# Patient Record
Sex: Female | Born: 1975 | Race: White | Hispanic: No | Marital: Single | Smoking: Former smoker
Health system: Southern US, Community
[De-identification: ages and names within clinical notes are randomized; demographics above are authoritative.]

## PROBLEM LIST (undated history)

## (undated) DIAGNOSIS — M5020 Other cervical disc displacement, unspecified cervical region: Secondary | ICD-10-CM

## (undated) DIAGNOSIS — Z9109 Other allergy status, other than to drugs and biological substances: Secondary | ICD-10-CM

## (undated) DIAGNOSIS — Z973 Presence of spectacles and contact lenses: Secondary | ICD-10-CM

## (undated) DIAGNOSIS — G839 Paralytic syndrome, unspecified: Secondary | ICD-10-CM

## (undated) HISTORY — PX: OVARIAN CYST REMOVAL: SHX89

## (undated) HISTORY — PX: TONSILLECTOMY: SUR1361

---

## 2014-02-22 ENCOUNTER — Other Ambulatory Visit (HOSPITAL_COMMUNITY): Payer: Self-pay | Admitting: Orthopaedic Surgery

## 2014-02-25 ENCOUNTER — Encounter (HOSPITAL_COMMUNITY): Payer: Self-pay | Admitting: *Deleted

## 2014-02-25 DIAGNOSIS — M5000 Cervical disc disorder with myelopathy, unspecified cervical region: Secondary | ICD-10-CM | POA: Diagnosis present

## 2014-02-25 NOTE — H&P (Signed)
Kylie Romero is an 38 y.o. female.   Chief Complaint: neck and left arm pain. HPI: referred by Dr. 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  The patient does pizza delivery.  She has been having trouble walking with spasticity of her left lower extremity and left upper extremity.  She states she has had cramping with squeezing of her fingers, numbness in her left upper extremity and difficulty with gait and now has noticed some problems with her opposite right lower extremity as well.  Onset was about 4 months ago.  She has had some progression, but still has been able to work.  She is normally followed by Dr. Altamease OilerNoelle Redmon.The patient states that she has had some difficulty with walking, fatigue, tiredness of her legs and difficulty with stairs.  She has had previous EMGs/nerve conduction velocity back in January which were normal.  Her symptoms have gradually progressed since that point in time and now she is having more difficulty ambulating.  She has not had to use a cane, but sometimes grabs the counter or other objects with ambulation.  RADIOGRAPHS/TEST:   C-spine x-rays are reviewed with the patient which shows no Klippel-Feil deformity.  No hemivertebrae.  Slight sclerosis of the end plates of Z6-1C5-6.     MRI scan from 02/10/2014 show disk osteophyte complex at C5-6 with severe central stenosis with cord edematous changes at the level of the disk slightly above and inferior behind the C6 vertebral body that extends almost to the next disk space.  At C6-7 level there is mild disk protrusion without contact of the cord.  No past medical history on file.  No past surgical history on file.  No family history on file. Social History:  has no tobacco, alcohol, and drug history on file.  Allergies: No Known Allergies  No prescriptions prior to admission    No results found for this or any previous visit (from the past 48 hour(s)). No results found.  Review of  Systems  Musculoskeletal: Positive for neck pain.  Neurological: Positive for tingling, sensory change and focal weakness.  All other systems reviewed and are negative.   There were no vitals taken for this visit. Physical Exam  Constitutional: She is oriented to person, place, and time. She appears well-developed and well-nourished.  HENT:  Head: Normocephalic and atraumatic.  Eyes: EOM are normal. Pupils are equal, round, and reactive to light.  Cardiovascular: Normal rate and regular rhythm.   Respiratory: Effort normal and breath sounds normal.  GI: Soft. Bowel sounds are normal.  Musculoskeletal:   She has severe brachial plexus tenderness worse on the left than right, positive Spurling, positive Lhermitte, positive Hoffman sign.  There is bilateral lower extremity hyperreflexia 4+ with clonus worse on the left than right.  There is 3+ upper extremity reflexes, biceps, triceps, brachioradialis.  She has 5-/5 biceps and wrist flexion, wrist extension.  There is overall 5-/5 grip on the left versus normal on the right.  She has some quad weakness on the left with stairs.  She has to go up with the right foot first.    Neurological: She is alert and oriented to person, place, and time.  Skin: Skin is warm and dry.  Psychiatric: She has a normal mood and affect.     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  She has severe central stenosis PLAN:  Anterior cervical discectomy and fusion C5-6 with allograft,plate and  screws.  Wende NeighborsSheila M Carlene Bickley 02/25/2014, 10:24 AM

## 2014-02-27 MED ORDER — CEFAZOLIN SODIUM-DEXTROSE 2-3 GM-% IV SOLR: 2.0000 g | INTRAVENOUS | Status: DC

## 2014-02-28 ENCOUNTER — Ambulatory Visit (HOSPITAL_COMMUNITY): Payer: 59

## 2014-02-28 ENCOUNTER — Encounter (HOSPITAL_COMMUNITY): Payer: 59 | Admitting: Certified Registered Nurse Anesthetist

## 2014-02-28 ENCOUNTER — Ambulatory Visit (HOSPITAL_COMMUNITY): Payer: 59 | Admitting: Certified Registered Nurse Anesthetist

## 2014-02-28 ENCOUNTER — Ambulatory Visit (HOSPITAL_COMMUNITY)
Admission: RE | Admit: 2014-02-28 | Discharge: 2014-03-01 | Disposition: A | Discharge status: Home / Self Care | Payer: 59 | Source: Ambulatory Visit | Attending: Orthopaedic Surgery | Admitting: Orthopaedic Surgery

## 2014-02-28 ENCOUNTER — Encounter (HOSPITAL_COMMUNITY): Payer: Self-pay | Admitting: *Deleted

## 2014-02-28 ENCOUNTER — Encounter (HOSPITAL_COMMUNITY)
Admission: RE | Disposition: A | Discharge status: Home / Self Care | Payer: Self-pay | Source: Ambulatory Visit | Attending: Orthopaedic Surgery

## 2014-02-28 DIAGNOSIS — M4712 Other spondylosis with myelopathy, cervical region: Secondary | ICD-10-CM | POA: Diagnosis present

## 2014-02-28 DIAGNOSIS — J4489 Other specified chronic obstructive pulmonary disease: Secondary | ICD-10-CM | POA: Insufficient documentation

## 2014-02-28 DIAGNOSIS — Z87891 Personal history of nicotine dependence: Secondary | ICD-10-CM | POA: Insufficient documentation

## 2014-02-28 DIAGNOSIS — J449 Chronic obstructive pulmonary disease, unspecified: Secondary | ICD-10-CM | POA: Insufficient documentation

## 2014-02-28 DIAGNOSIS — M5000 Cervical disc disorder with myelopathy, unspecified cervical region: Secondary | ICD-10-CM | POA: Insufficient documentation

## 2014-02-28 HISTORY — DX: Other allergy status, other than to drugs and biological substances: Z91.09

## 2014-02-28 HISTORY — PX: ANTERIOR CERVICAL DECOMP/DISCECTOMY FUSION: SHX1161

## 2014-02-28 LAB — CBC
HCT: 35.9 % — ABNORMAL LOW (ref 36.0–46.0)
HEMATOCRIT: 37.5 % (ref 36.0–46.0)
HEMOGLOBIN: 13.3 g/dL (ref 12.0–15.0)
Hemoglobin: 12.5 g/dL (ref 12.0–15.0)
MCH: 32.7 pg (ref 26.0–34.0)
MCH: 32 pg (ref 26.0–34.0)
MCHC: 35.5 g/dL (ref 30.0–36.0)
MCHC: 34.8 g/dL (ref 30.0–36.0)
MCV: 92.1 fL (ref 78.0–100.0)
MCV: 91.8 fL (ref 78.0–100.0)
Platelets: 228 10*3/uL (ref 150–400)
Platelets: 212 10*3/uL (ref 150–400)
RBC: 4.07 MIL/uL (ref 3.87–5.11)
RBC: 3.91 MIL/uL (ref 3.87–5.11)
RDW: 12.4 % (ref 11.5–15.5)
RDW: 12.1 % (ref 11.5–15.5)
WBC: 8.2 10*3/uL (ref 4.0–10.5)
WBC: 7.9 10*3/uL (ref 4.0–10.5)

## 2014-02-28 LAB — BASIC METABOLIC PANEL
BUN: 11 mg/dL (ref 6–23)
CHLORIDE: 108 meq/L (ref 96–112)
CO2: 22 mEq/L (ref 19–32)
Calcium: 9 mg/dL (ref 8.4–10.5)
Creatinine, Ser: 0.69 mg/dL (ref 0.50–1.10)
GFR calc Af Amer: >90 mL/min (ref >90)
GLUCOSE: 88 mg/dL (ref 70–99)
POTASSIUM: 4.9 meq/L (ref 3.7–5.3)
SODIUM: 142 meq/L (ref 137–147)

## 2014-02-28 LAB — URINE MICROSCOPIC-ADD ON

## 2014-02-28 LAB — URINALYSIS, ROUTINE W REFLEX MICROSCOPIC
Bilirubin Urine: NEGATIVE
Glucose, UA: NEGATIVE mg/dL
Hgb urine dipstick: NEGATIVE
Ketones, ur: 15 mg/dL — AB
NITRITE: NEGATIVE
PH: 6.5 (ref 5.0–8.0)
Protein, ur: NEGATIVE mg/dL
SPECIFIC GRAVITY, URINE: 1.016 (ref 1.005–1.030)
Urobilinogen, UA: 1 mg/dL (ref 0.0–1.0)

## 2014-02-28 LAB — SURGICAL PCR SCREEN
MRSA, PCR: NEGATIVE
Staphylococcus aureus: NEGATIVE

## 2014-02-28 LAB — HCG, SERUM, QUALITATIVE: Preg, Serum: NEGATIVE

## 2014-02-28 LAB — PROTIME-INR
INR: 1.08 (ref 0.00–1.49)
Prothrombin Time: 13.8 seconds (ref 11.6–15.2)

## 2014-02-28 SURGERY — ANTERIOR CERVICAL DECOMPRESSION/DISCECTOMY FUSION 1 LEVEL
Anesthesia: General | Site: Neck

## 2014-02-28 MED ORDER — CEFAZOLIN SODIUM-DEXTROSE 2-3 GM-% IV SOLR
INTRAVENOUS | Status: AC
Start: 2014-02-28 — End: 2014-03-01
  Filled 2014-02-28: qty 50

## 2014-02-28 MED ORDER — METHOCARBAMOL 500 MG PO TABS
500.0000 mg | ORAL_TABLET | Freq: Four times a day (QID) | ORAL | Status: DC | PRN
  Administered 2014-02-28 – 2014-03-01 (×2): 500 mg via ORAL
  Filled 2014-02-28 (×2): qty 1

## 2014-02-28 MED ORDER — FENTANYL CITRATE 0.05 MG/ML IJ SOLN
INTRAMUSCULAR | Status: DC | PRN
Start: 2014-02-28 — End: 2014-02-28
  Administered 2014-02-28 (×2): 50 ug via INTRAVENOUS
  Administered 2014-02-28: 150 ug via INTRAVENOUS

## 2014-02-28 MED ORDER — OXYCODONE-ACETAMINOPHEN 5-325 MG PO TABS: 1.0000 | ORAL_TABLET | ORAL | Status: DC | PRN

## 2014-02-28 MED ORDER — MUPIROCIN 2 % EX OINT
TOPICAL_OINTMENT | Freq: Two times a day (BID) | CUTANEOUS | Status: DC
  Administered 2014-02-28: 13:00:00 via NASAL
  Filled 2014-02-28: qty 22

## 2014-02-28 MED ORDER — PHENYLEPHRINE 40 MCG/ML (10ML) SYRINGE FOR IV PUSH (FOR BLOOD PRESSURE SUPPORT)
PREFILLED_SYRINGE | INTRAVENOUS | Status: AC
  Filled 2014-02-28: qty 10

## 2014-02-28 MED ORDER — PHENOL 1.4 % MT LIQD
1.0000 | OROMUCOSAL | Status: DC | PRN
  Filled 2014-02-28: qty 177

## 2014-02-28 MED ORDER — GLYCOPYRROLATE 0.2 MG/ML IJ SOLN
INTRAMUSCULAR | Status: AC
  Filled 2014-02-28: qty 3

## 2014-02-28 MED ORDER — NEOSTIGMINE METHYLSULFATE 1 MG/ML IJ SOLN
INTRAMUSCULAR | Status: AC
  Filled 2014-02-28: qty 10

## 2014-02-28 MED ORDER — BUPIVACAINE-EPINEPHRINE 0.25% -1:200000 IJ SOLN
INTRAMUSCULAR | Status: DC | PRN
  Administered 2014-02-28: 30 mL

## 2014-02-28 MED ORDER — KETOROLAC TROMETHAMINE 30 MG/ML IJ SOLN
30.0000 mg | Freq: Once | INTRAMUSCULAR | Status: AC
  Administered 2014-02-28: 30 mg via INTRAVENOUS
  Filled 2014-02-28: qty 1

## 2014-02-28 MED ORDER — ACETAMINOPHEN 325 MG PO TABS: 325.0000 mg | ORAL_TABLET | ORAL | Status: DC | PRN

## 2014-02-28 MED ORDER — SODIUM CHLORIDE 0.9 % IJ SOLN: 3.0000 mL | INTRAMUSCULAR | Status: DC | PRN

## 2014-02-28 MED ORDER — THROMBIN 20000 UNITS EX SOLR
CUTANEOUS | Status: DC | PRN
  Administered 2014-02-28: 16:00:00 via TOPICAL

## 2014-02-28 MED ORDER — ACETAMINOPHEN 160 MG/5ML PO SOLN
325.0000 mg | ORAL | Status: DC | PRN
  Filled 2014-02-28: qty 20.3

## 2014-02-28 MED ORDER — LORATADINE 10 MG PO TABS
10.0000 mg | ORAL_TABLET | Freq: Every day | ORAL | Status: DC
  Administered 2014-02-28: 10 mg via ORAL
  Filled 2014-02-28 (×2): qty 1

## 2014-02-28 MED ORDER — LIDOCAINE HCL (CARDIAC) 20 MG/ML IV SOLN
INTRAVENOUS | Status: AC
  Filled 2014-02-28: qty 5

## 2014-02-28 MED ORDER — ACETAMINOPHEN 325 MG PO TABS: 650.0000 mg | ORAL_TABLET | ORAL | Status: DC | PRN

## 2014-02-28 MED ORDER — PHENYLEPHRINE HCL 10 MG/ML IJ SOLN
INTRAMUSCULAR | Status: DC | PRN
Start: 2014-02-28 — End: 2014-02-28
  Administered 2014-02-28 (×3): 40 ug via INTRAVENOUS

## 2014-02-28 MED ORDER — NEOSTIGMINE METHYLSULFATE 1 MG/ML IJ SOLN
INTRAMUSCULAR | Status: DC | PRN
  Administered 2014-02-28: 4 mg via INTRAVENOUS

## 2014-02-28 MED ORDER — KETOROLAC TROMETHAMINE 30 MG/ML IJ SOLN
15.0000 mg | Freq: Once | INTRAMUSCULAR | Status: AC | PRN
  Administered 2014-02-28: 15 mg via INTRAVENOUS

## 2014-02-28 MED ORDER — MORPHINE SULFATE 2 MG/ML IJ SOLN: 1.0000 mg | INTRAMUSCULAR | Status: DC | PRN

## 2014-02-28 MED ORDER — GLYCOPYRROLATE 0.2 MG/ML IJ SOLN
INTRAMUSCULAR | Status: DC | PRN
  Administered 2014-02-28: .7 mg via INTRAVENOUS

## 2014-02-28 MED ORDER — ROCURONIUM BROMIDE 50 MG/5ML IV SOLN
INTRAVENOUS | Status: AC
  Filled 2014-02-28: qty 1

## 2014-02-28 MED ORDER — BUPIVACAINE-EPINEPHRINE (PF) 0.25% -1:200000 IJ SOLN
INTRAMUSCULAR | Status: AC
  Filled 2014-02-28: qty 30

## 2014-02-28 MED ORDER — PROPOFOL 10 MG/ML IV BOLUS
INTRAVENOUS | Status: DC | PRN
  Administered 2014-02-28: 140 mg via INTRAVENOUS
  Administered 2014-02-28: 50 mg via INTRAVENOUS

## 2014-02-28 MED ORDER — KETOROLAC TROMETHAMINE 30 MG/ML IJ SOLN
INTRAMUSCULAR | Status: AC
  Filled 2014-02-28: qty 1

## 2014-02-28 MED ORDER — DEXAMETHASONE SODIUM PHOSPHATE 4 MG/ML IJ SOLN
INTRAMUSCULAR | Status: DC | PRN
  Administered 2014-02-28: 4 mg via INTRAVENOUS

## 2014-02-28 MED ORDER — HYDROCODONE-ACETAMINOPHEN 5-325 MG PO TABS: 1.0000 | ORAL_TABLET | ORAL | Status: DC | PRN

## 2014-02-28 MED ORDER — KCL IN DEXTROSE-NACL 20-5-0.45 MEQ/L-%-% IV SOLN
INTRAVENOUS | Status: DC
  Filled 2014-02-28 (×3): qty 1000

## 2014-02-28 MED ORDER — POLYETHYLENE GLYCOL 3350 17 G PO PACK
17.0000 g | PACK | Freq: Every day | ORAL | Status: DC | PRN
  Filled 2014-02-28: qty 1

## 2014-02-28 MED ORDER — METHOCARBAMOL 100 MG/ML IJ SOLN: 500.0000 mg | Freq: Four times a day (QID) | INTRAVENOUS | Status: DC | PRN

## 2014-02-28 MED ORDER — SODIUM CHLORIDE 0.9 % IJ SOLN: 3.0000 mL | Freq: Two times a day (BID) | INTRAMUSCULAR | Status: DC

## 2014-02-28 MED ORDER — MIDAZOLAM HCL 2 MG/2ML IJ SOLN
INTRAMUSCULAR | Status: AC
  Filled 2014-02-28: qty 2

## 2014-02-28 MED ORDER — BISACODYL 10 MG RE SUPP: 10.0000 mg | Freq: Every day | RECTAL | Status: DC | PRN

## 2014-02-28 MED ORDER — SODIUM CHLORIDE 0.9 % IV SOLN: 250.0000 mL | INTRAVENOUS | Status: DC

## 2014-02-28 MED ORDER — FENTANYL CITRATE 0.05 MG/ML IJ SOLN
INTRAMUSCULAR | Status: AC
  Filled 2014-02-28: qty 5

## 2014-02-28 MED ORDER — CEFAZOLIN SODIUM-DEXTROSE 2-3 GM-% IV SOLR
INTRAVENOUS | Status: DC | PRN
  Administered 2014-02-28: 2 g via INTRAVENOUS

## 2014-02-28 MED ORDER — HYDROMORPHONE HCL PF 1 MG/ML IJ SOLN
INTRAMUSCULAR | Status: AC
  Filled 2014-02-28: qty 1

## 2014-02-28 MED ORDER — ACETAMINOPHEN 650 MG RE SUPP: 650.0000 mg | RECTAL | Status: DC | PRN

## 2014-02-28 MED ORDER — ONDANSETRON HCL 4 MG/2ML IJ SOLN
INTRAMUSCULAR | Status: DC | PRN
  Administered 2014-02-28: 4 mg via INTRAVENOUS

## 2014-02-28 MED ORDER — LIDOCAINE HCL (CARDIAC) 20 MG/ML IV SOLN
INTRAVENOUS | Status: DC | PRN
  Administered 2014-02-28: 80 mg via INTRAVENOUS

## 2014-02-28 MED ORDER — ONDANSETRON HCL 4 MG/2ML IJ SOLN
INTRAMUSCULAR | Status: AC
  Filled 2014-02-28: qty 2

## 2014-02-28 MED ORDER — MENTHOL 3 MG MT LOZG
1.0000 | LOZENGE | OROMUCOSAL | Status: DC | PRN
  Filled 2014-02-28: qty 9

## 2014-02-28 MED ORDER — MUPIROCIN 2 % EX OINT
TOPICAL_OINTMENT | CUTANEOUS | Status: AC
  Filled 2014-02-28: qty 22

## 2014-02-28 MED ORDER — PANTOPRAZOLE SODIUM 40 MG IV SOLR
40.0000 mg | Freq: Every day | INTRAVENOUS | Status: DC
  Administered 2014-02-28: 40 mg via INTRAVENOUS
  Filled 2014-02-28 (×2): qty 40

## 2014-02-28 MED ORDER — OXYCODONE-ACETAMINOPHEN 5-325 MG PO TABS
1.0000 | ORAL_TABLET | ORAL | Status: DC | PRN
  Administered 2014-03-01: 2 via ORAL
  Administered 2014-03-01: 1 via ORAL
  Filled 2014-02-28: qty 1
  Filled 2014-02-28: qty 2

## 2014-02-28 MED ORDER — LACTATED RINGERS IV SOLN
INTRAVENOUS | Status: DC | PRN
  Administered 2014-02-28 (×2): via INTRAVENOUS

## 2014-02-28 MED ORDER — ONDANSETRON HCL 4 MG/2ML IJ SOLN: 4.0000 mg | Freq: Once | INTRAMUSCULAR | Status: DC | PRN

## 2014-02-28 MED ORDER — METHOCARBAMOL 500 MG PO TABS: 500.0000 mg | ORAL_TABLET | Freq: Four times a day (QID) | ORAL | Status: DC | PRN

## 2014-02-28 MED ORDER — ONDANSETRON HCL 4 MG/2ML IJ SOLN: 4.0000 mg | INTRAMUSCULAR | Status: DC | PRN

## 2014-02-28 MED ORDER — DOCUSATE SODIUM 100 MG PO CAPS
100.0000 mg | ORAL_CAPSULE | Freq: Two times a day (BID) | ORAL | Status: DC
  Administered 2014-02-28: 100 mg via ORAL
  Filled 2014-02-28 (×3): qty 1

## 2014-02-28 MED ORDER — CEFAZOLIN SODIUM 1-5 GM-% IV SOLN
1.0000 g | Freq: Three times a day (TID) | INTRAVENOUS | Status: AC
  Administered 2014-02-28 – 2014-03-01 (×2): 1 g via INTRAVENOUS
  Filled 2014-02-28 (×2): qty 50

## 2014-02-28 MED ORDER — THROMBIN 20000 UNITS EX SOLR
CUTANEOUS | Status: AC
  Filled 2014-02-28: qty 20000

## 2014-02-28 MED ORDER — 0.9 % SODIUM CHLORIDE (POUR BTL) OPTIME
TOPICAL | Status: DC | PRN
  Administered 2014-02-28: 1000 mL

## 2014-02-28 MED ORDER — HYDROMORPHONE HCL PF 1 MG/ML IJ SOLN
0.2500 mg | INTRAMUSCULAR | Status: DC | PRN
  Administered 2014-02-28 (×2): 0.5 mg via INTRAVENOUS

## 2014-02-28 MED ORDER — FLEET ENEMA 7-19 GM/118ML RE ENEM: 1.0000 | ENEMA | Freq: Once | RECTAL | Status: AC | PRN

## 2014-02-28 SURGICAL SUPPLY — 60 items
BENZOIN TINCTURE PRP APPL 2/3 (GAUZE/BANDAGES/DRESSINGS) ×2 IMPLANT
BIT DRILL SKYLINE 12MM (BIT) ×1 IMPLANT
BLADE SURG ROTATE 9660 (MISCELLANEOUS) IMPLANT
BONE CERV LORDOTIC 14.5X12X6 (Bone Implant) ×2 IMPLANT
BUR ROUND FLUTED 4 SOFT TCH (BURR) IMPLANT
CLSR STERI-STRIP ANTIMIC 1/2X4 (GAUZE/BANDAGES/DRESSINGS) ×2 IMPLANT
COLLAR CERV LO CONTOUR FIRM DE (SOFTGOODS) ×2 IMPLANT
CORDS BIPOLAR (ELECTRODE) ×2 IMPLANT
COVER MAYO STAND STRL (DRAPES) ×2 IMPLANT
COVER SURGICAL LIGHT HANDLE (MISCELLANEOUS) ×2 IMPLANT
DERMABOND ADVANCED (GAUZE/BANDAGES/DRESSINGS) ×1
DERMABOND ADVANCED .7 DNX12 (GAUZE/BANDAGES/DRESSINGS) ×1 IMPLANT
DRAPE C-ARM 42X72 X-RAY (DRAPES) ×2 IMPLANT
DRAPE INCISE IOBAN 66X45 STRL (DRAPES) ×2 IMPLANT
DRAPE MICROSCOPE LEICA (MISCELLANEOUS) ×2 IMPLANT
DRAPE ORTHO SPLIT 77X108 STRL (DRAPES) ×2
DRAPE PROXIMA HALF (DRAPES) ×2 IMPLANT
DRAPE SURG ORHT 6 SPLT 77X108 (DRAPES) ×2 IMPLANT
DRILL BIT SKYLINE 12MM (BIT) ×1
DRSG MEPILEX BORDER 4X4 (GAUZE/BANDAGES/DRESSINGS) ×2 IMPLANT
DRSG MEPILEX BORDER 4X8 (GAUZE/BANDAGES/DRESSINGS) ×2 IMPLANT
DURAPREP 6ML APPLICATOR 50/CS (WOUND CARE) ×2 IMPLANT
ELECT COATED BLADE 2.86 ST (ELECTRODE) ×2 IMPLANT
ELECT REM PT RETURN 9FT ADLT (ELECTROSURGICAL) ×2
ELECTRODE REM PT RTRN 9FT ADLT (ELECTROSURGICAL) ×1 IMPLANT
EVACUATOR 1/8 PVC DRAIN (DRAIN) ×2 IMPLANT
GAUZE XEROFORM 1X8 LF (GAUZE/BANDAGES/DRESSINGS) ×4 IMPLANT
GLOVE BIOGEL PI IND STRL 7.5 (GLOVE) ×1 IMPLANT
GLOVE BIOGEL PI IND STRL 8 (GLOVE) ×1 IMPLANT
GLOVE BIOGEL PI INDICATOR 7.5 (GLOVE) ×1
GLOVE BIOGEL PI INDICATOR 8 (GLOVE) ×1
GLOVE ECLIPSE 7.0 STRL STRAW (GLOVE) ×2 IMPLANT
GLOVE ORTHO TXT STRL SZ7.5 (GLOVE) ×2 IMPLANT
GOWN STRL REUS W/ TWL LRG LVL3 (GOWN DISPOSABLE) ×3 IMPLANT
GOWN STRL REUS W/ TWL XL LVL3 (GOWN DISPOSABLE) ×1 IMPLANT
GOWN STRL REUS W/TWL LRG LVL3 (GOWN DISPOSABLE) ×3
GOWN STRL REUS W/TWL XL LVL3 (GOWN DISPOSABLE) ×1
HEAD HALTER (SOFTGOODS) ×2 IMPLANT
HEMOSTAT SURGICEL 2X14 (HEMOSTASIS) IMPLANT
KIT BASIN OR (CUSTOM PROCEDURE TRAY) ×2 IMPLANT
KIT ROOM TURNOVER OR (KITS) ×2 IMPLANT
MANIFOLD NEPTUNE II (INSTRUMENTS) IMPLANT
NEEDLE 25GX 5/8IN NON SAFETY (NEEDLE) ×2 IMPLANT
NS IRRIG 1000ML POUR BTL (IV SOLUTION) ×2 IMPLANT
PACK ORTHO CERVICAL (CUSTOM PROCEDURE TRAY) ×2 IMPLANT
PAD ARMBOARD 7.5X6 YLW CONV (MISCELLANEOUS) ×4 IMPLANT
PATTIES SURGICAL .5 X.5 (GAUZE/BANDAGES/DRESSINGS) IMPLANT
PLATE SKYLINE 12MM (Plate) ×2 IMPLANT
SCREW VARIABLE SELF TAP 12MM (Screw) ×8 IMPLANT
SPONGE GAUZE 4X4 12PLY (GAUZE/BANDAGES/DRESSINGS) ×2 IMPLANT
SPONGE GAUZE 4X4 12PLY STER LF (GAUZE/BANDAGES/DRESSINGS) ×2 IMPLANT
SURGIFLO TRUKIT (HEMOSTASIS) IMPLANT
SUT VIC AB 3-0 X1 27 (SUTURE) ×2 IMPLANT
SUT VICRYL 4-0 PS2 18IN ABS (SUTURE) ×4 IMPLANT
SYR 30ML SLIP (SYRINGE) ×2 IMPLANT
SYR BULB 3OZ (MISCELLANEOUS) ×2 IMPLANT
TAPE CLOTH SURG 4X10 WHT LF (GAUZE/BANDAGES/DRESSINGS) ×2 IMPLANT
TOWEL OR 17X24 6PK STRL BLUE (TOWEL DISPOSABLE) ×2 IMPLANT
TOWEL OR 17X26 10 PK STRL BLUE (TOWEL DISPOSABLE) ×2 IMPLANT
WATER STERILE IRR 1000ML POUR (IV SOLUTION) ×2 IMPLANT

## 2014-02-28 NOTE — Anesthesia Postprocedure Evaluation (Signed)
  Anesthesia Post-op Note  Patient: Oleh GeninAlexis Tiu  Procedure(s) Performed: Procedure(s) with comments: ANTERIOR CERVICAL DECOMPRESSION/DISCECTOMY FUSION 1 LEVEL (N/A) - C5-6 Anterior Cervical Discectomy and Fusion, Allograft, Plate  Patient Location: PACU  Anesthesia Type:General  Level of Consciousness: awake and alert   Airway and Oxygen Therapy: Patient Spontanous Breathing  Post-op Pain: mild  Post-op Assessment: Post-op Vital signs reviewed, Patient's Cardiovascular Status Stable, Respiratory Function Stable, Patent Airway, No signs of Nausea or vomiting and Adequate PO intake  Post-op Vital Signs: Reviewed and stable  Last Vitals:  Filed Vitals:   02/28/14 1834  BP:   Pulse:   Temp: 37.1 C  Resp:     Complications: No apparent anesthesia complications

## 2014-02-28 NOTE — Interval H&P Note (Signed)
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 After consideration of risks, benefits and other options for treatment, the patient has consented to  Procedure(s) with comments: ANTERIOR CERVICAL DECOMPRESSION/DISCECTOMY FUSION 1 LEVEL (N/A) - C5-6 Anterior Cervical Discectomy and Fusion, Allograft, Plate as a surgical intervention .  The patient's history has been reviewed, patient examined, no change in status, stable for surgery.  I have reviewed the patient's chart and labs.  Questions were answered to the patient's satisfaction.     Eldred MangesMark C Santhiago Collingsworth

## 2014-02-28 NOTE — Anesthesia Preprocedure Evaluation (Addendum)
Anesthesia Evaluation  Patient identified by MRN, date of birth, ID band Patient awake    Reviewed: Allergy & Precautions, H&P , NPO status , Patient's Chart, lab work & pertinent test results, reviewed documented beta blocker date and time   History of Anesthesia Complications Negative for: history of anesthetic complications  Airway Mallampati: I TM Distance: >3 FB Neck ROM: Limited    Dental  (+) Teeth Intact, Dental Advisory Given   Pulmonary neg COPDformer smoker,  Neck pain with left arm numbness/weakness, no changes with neck movement breath sounds clear to auscultation        Cardiovascular negative cardio ROS  Rhythm:Regular     Neuro/Psych negative neurological ROS  negative psych ROS   GI/Hepatic negative GI ROS, Neg liver ROS,   Endo/Other  negative endocrine ROS  Renal/GU negative Renal ROS     Musculoskeletal   Abdominal   Peds  Hematology negative hematology ROS (+)   Anesthesia Other Findings   Reproductive/Obstetrics                          Anesthesia Physical Anesthesia Plan  ASA: II  Anesthesia Plan: General   Post-op Pain Management:    Induction: Intravenous  Airway Management Planned: Oral ETT  Additional Equipment: None  Intra-op Plan:   Post-operative Plan: Extubation in OR  Informed Consent: I have reviewed the patients History and Physical, chart, labs and discussed the procedure including the risks, benefits and alternatives for the proposed anesthesia with the patient or authorized representative who has indicated his/her understanding and acceptance.   Dental advisory given  Plan Discussed with: CRNA and Surgeon  Anesthesia Plan Comments:        Anesthesia Quick Evaluation

## 2014-02-28 NOTE — Transfer of Care (Signed)
Immediate Anesthesia Transfer of Care Note  Patient: Kylie GeninAlexis Romero  Procedure(s) Performed: Procedure(s) with comments: ANTERIOR CERVICAL DECOMPRESSION/DISCECTOMY FUSION 1 LEVEL (N/A) - C5-6 Anterior Cervical Discectomy and Fusion, Allograft, Plate  Patient Location: PACU  Anesthesia Type:General  Level of Consciousness: awake and alert   Airway & Oxygen Therapy: Patient Spontanous Breathing and Patient connected to nasal cannula oxygen  Post-op Assessment: Report given to PACU RN, Post -op Vital signs reviewed and stable and Patient moving all extremities X 4  Post vital signs: Reviewed and stable  Complications: No apparent anesthesia complications

## 2014-02-28 NOTE — Discharge Instructions (Signed)
No lifting greater than 10 lbs. No overhead use of arms. °Avoid bending,and twisting neck. °Walk in house for first week them may start to get out slowly increasing distance up to one mile by 3 weeks post op. °Keep incision dry for 3 days, may then bathe and wet incision using a covered collar when showering. °Call if any fevers >101, chills, or increasing numbness or weakness or increased swelling or drainage. ° °

## 2014-02-28 NOTE — Brief Op Note (Cosign Needed)
02/28/2014  4:36 PM  PATIENT:  Kylie Romero  38 y.o. 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  POST-OPERATIVE DIAGNOSIS:  C5-6 HNP with Myelopathy  PROCEDURE:  Procedure(s) with comments: ANTERIOR CERVICAL DECOMPRESSION/DISCECTOMY FUSION 1 LEVEL (N/A) - C5-6 Anterior Cervical Discectomy and Fusion, Allograft, Plate  SURGEON:  Surgeon(s) and Role:    * Eldred MangesMark C Yates, MD - Primary  PHYSICIAN ASSISTANT: Maud DeedSheila Shoichi Mielke [facility]  ASSISTANTS: none   ANESTHESIA:   general  EBL:  Total I/O In: 1000 [I.V.:1000] Out: -   BLOOD ADMINISTERED:none  DRAINS: (1) Hemovact drain(s) in the anterior neck with  Suction Open   LOCAL MEDICATIONS USED:  MARCAINE     SPECIMEN:  No Specimen  DISPOSITION OF SPECIMEN:  N/A  COUNTS:  YES  TOURNIQUET:  * No tourniquets in log *  DICTATION: .Note written in EPIC  PLAN OF CARE: Admit for overnight observation  PATIENT DISPOSITION:  PACU - hemodynamically stable.   Delay start of Pharmacological VTE agent (>24hrs) due to surgical blood loss or risk of bleeding: yes

## 2014-02-28 NOTE — Anesthesia Procedure Notes (Signed)
Procedure Name: Intubation Date/Time: 02/28/2014 3:06 PM Performed by: Reine JustFLOWERS, Wong Steadham T Pre-anesthesia Checklist: Patient identified, Emergency Drugs available, Suction available, Patient being monitored and Timeout performed Patient Re-evaluated:Patient Re-evaluated prior to inductionOxygen Delivery Method: Circle system utilized and Simple face mask Preoxygenation: Pre-oxygenation with 100% oxygen Intubation Type: IV induction Ventilation: Mask ventilation without difficulty and Oral airway inserted - appropriate to patient size Laryngoscope size: elective glidescope. Grade View: Grade I Tube type: Oral Tube size: 7.5 mm Number of attempts: 1 Airway Equipment and Method: Patient positioned with wedge pillow,  Stylet and Video-laryngoscopy Placement Confirmation: ETT inserted through vocal cords under direct vision,  positive ETCO2 and breath sounds checked- equal and bilateral Secured at: 22 cm Tube secured with: Tape Dental Injury: Teeth and Oropharynx as per pre-operative assessment

## 2014-02-28 NOTE — Op Note (Signed)
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  Assistant: Maud DeedSheila Vernon PA-C medically necessary and present for the entire procedure  Anesthesia Gen. plus Marcaine skin local  Complications none  EBL minimal.  Procedure after induction general anesthesia standard prepping draping the timeout procedure was completed patient had halter traction applied DuraPrep was used area square totalis sterile skin marker Betadine half Steri-Drape sterile Mayo stand  at the head and split sheets and drapes were used. Patient preoperatively had extreme weakness in the left upper and lower extremity. She had symptoms for 4 months an MRI scan showed edema of the cord at the C5-6 level and slightly inferior. Patient had some short pedicles. Minimal degeneration at C6-7 without compression. Timeout procedure was completed. Incision was made starting at the midline extending to the left and lung palpable landmarks there were C5-6. Platysma was divided in line with the skin incision blunt dissection with longus COLI muscles split at the midline and the spurs C5-6 was identified confirmed with crosstable lateral C-arm after was appropriately draped. Operative microscope was draped and brought in. Spurs were taken down disc was removed there was a large amount of disc protruding posteriorly and once decompression and removal of the hypertrophic ligament was performed the dura was seen bulging out with all ligament resected. There were no extruded fragments present. 6 mm graft was selected so to avoid over stretching the cord since there was arty some edema. Endplates were filed at the down and some additional touchup with the burn hang curettes to make sure that the graft  fit and nice and snug with the maximum body contact. There is egressed on each side for any fluid. Graft was countersunk 2 mm . A 12 mm skyline plate was selected 12 mm screws were inserted above and below for total of 4 screws. C-arm was used to confirm good position and alignment. Hemovac drain was placed in line with skin incision. Tincture benzoin Steri-Strips 4 x 4's tape soft cervical collar was applied postoperatively. Patient tolerated the procedure well was transferred to the recovery in in stable condition signed Vernon PreyMark Maigan Bittinger M.D.

## 2014-03-01 ENCOUNTER — Encounter (HOSPITAL_COMMUNITY): Payer: Self-pay | Admitting: Orthopaedic Surgery

## 2014-03-01 DIAGNOSIS — M4712 Other spondylosis with myelopathy, cervical region: Secondary | ICD-10-CM | POA: Diagnosis not present

## 2014-03-01 NOTE — Progress Notes (Signed)
Orthopedic Tech Progress Note Patient Details:  Kylie Romero 15-Apr-1976 [id number] Extra soft collar provided as requested for home use Ortho Devices Type of Ortho Device: Soft collar Ortho Device/Splint Interventions: Ordered   Asia R Thompson 03/01/2014, 8:56 AM

## 2014-03-01 NOTE — Progress Notes (Signed)
Patient ID: Kylie Romero, female   DOB: 1976-07-27, 38 y.o.   MRN: [id number]     Subjective: 1 Day Post-Op Procedure(s) (LRB): ANTERIOR CERVICAL DECOMPRESSION/DISCECTOMY FUSION 1 LEVEL (N/A) Patient reports pain as mild.    Objective: Vital signs in last 24 hours: Temp:  [97.8 F (36.6 C)-98.8 F (37.1 C)] 98.6 F (37 C) (04/21 0413) Pulse Rate:  [63-103] 67 (04/21 0413) Resp:  [11-22] 17 (04/21 0413) BP: (101-134)/(57-82) 101/57 mmHg (04/21 0413) SpO2:  [84 %-100 %] 97 % (04/21 0413) Weight:  [68.153 kg (150 lb 4 oz)] 68.153 kg (150 lb 4 oz) (04/20 1242)  Intake/Output from previous day: 04/20 0701 - 04/21 0700 In: 1300 [I.V.:1300] Out: 50 [Blood:50] Intake/Output this shift:     Recent Labs  02/28/14 1253 02/28/14 1354  HGB 13.3 12.5    Recent Labs  02/28/14 1253 02/28/14 1354  WBC 8.2 7.9  RBC 4.07 3.91  HCT 37.5 35.9*  PLT 228 212    Recent Labs  02/28/14 1354  NA 142  K 4.9  CL 108  CO2 22  BUN 11  CREATININE 0.69  GLUCOSE 88  CALCIUM 9.0    Recent Labs  02/28/14 1253  INR 1.08    no change in left UE and left LE spasticity. persistant numbness due to cord edema . she had had progression of weakness over 4 months and increase over last month.   Assessment/Plan: 1 Day Post-Op Procedure(s) (LRB): ANTERIOR CERVICAL DECOMPRESSION/DISCECTOMY FUSION 1 LEVEL (N/A) Plan: discharge home. Collar stays on at all times.  Eldred MangesMark C Zyonna Vardaman 03/01/2014, 7:49 AM

## 2014-03-01 NOTE — Progress Notes (Signed)
Pt. Alert and oriented, follows simple instructions, denies pain. Incision area without swelling, redness or S/S of infection. Voiding adequate clear yellow urine. Moving all extremities well and vitals stable and documented. Patient discharged home with spouse. Anterior Cervical Fusion surgery notes instructions given to patient and family member for home safety and precautions. Pt. and family stated understanding of instructions given.  

## 2019-04-22 ENCOUNTER — Emergency Department (HOSPITAL_BASED_OUTPATIENT_CLINIC_OR_DEPARTMENT_OTHER)
Admission: EM | Admit: 2019-04-22 | Discharge: 2019-04-22 | Disposition: A | Discharge status: Home / Self Care | Payer: BC Managed Care – PPO | Attending: Emergency Medicine | Admitting: Emergency Medicine

## 2019-04-22 ENCOUNTER — Other Ambulatory Visit: Payer: Self-pay

## 2019-04-22 ENCOUNTER — Encounter (HOSPITAL_BASED_OUTPATIENT_CLINIC_OR_DEPARTMENT_OTHER): Payer: Self-pay | Admitting: *Deleted

## 2019-04-22 DIAGNOSIS — M542 Cervicalgia: Secondary | ICD-10-CM | POA: Diagnosis present

## 2019-04-22 DIAGNOSIS — M5412 Radiculopathy, cervical region: Secondary | ICD-10-CM | POA: Diagnosis not present

## 2019-04-22 DIAGNOSIS — Z79899 Other long term (current) drug therapy: Secondary | ICD-10-CM | POA: Diagnosis not present

## 2019-04-22 DIAGNOSIS — Z87891 Personal history of nicotine dependence: Secondary | ICD-10-CM | POA: Diagnosis not present

## 2019-04-22 MED ORDER — OXYCODONE-ACETAMINOPHEN 5-325 MG PO TABS
1.0000 | ORAL_TABLET | Freq: Once | ORAL | Status: AC
  Administered 2019-04-22: 1 via ORAL
  Filled 2019-04-22: qty 1

## 2019-04-22 MED ORDER — METHOCARBAMOL 500 MG PO TABS: 500.0000 mg | ORAL_TABLET | Freq: Three times a day (TID) | ORAL | 0 refills | Status: AC | PRN

## 2019-04-22 MED ORDER — OXYCODONE-ACETAMINOPHEN 5-325 MG PO TABS: 1.0000 | ORAL_TABLET | Freq: Four times a day (QID) | ORAL | 0 refills | Status: DC | PRN

## 2019-04-22 NOTE — Discharge Instructions (Signed)
It was my pleasure taking care of you today!   I have given you a prescription for medication and a muscle relaxant. These can make you very drowsy - please do not drink alcohol, operate heavy machinery or drive on this medication.   In addition to these medications, you also can take ibuprofen or Aleve to help as well.  I have put in an order for you to get an MRI at the [facility].  I was told that they would call you.  I have listed the contact information.  If you have not heard from them by tomorrow afternoon, please give them a call.  Go to the  [facility] emergency department should you have any weakness of the right arm, new or worsening symptoms or any additional concerns.

## 2019-04-22 NOTE — ED Provider Notes (Signed)
[facility] EMERGENCY DEPARTMENT Provider Note   CSN: [id number] Arrival date & time: 04/22/19  1544    History   Chief Complaint Chief Complaint  Patient presents with  . Neck Pain    HPI Kylie Romero is a 43 y.o. female.     The history is provided by the patient and medical records. No language interpreter was used.  Neck Pain Associated symptoms: numbness   Associated symptoms: no weakness    Kylie Romero is a 43 y.o. female  with a PMH of Brown-Squard syndrome, prior cervical discectomy and fusion who presents to the Emergency Department complaining of progressively worsening right-sided neck pain for the last 3 days.  Pain gets much worse when she tries to lift her arm above her head as if to brush her hair.  He has tried several over-the-counter pain relievers with little improvement.  She reports that she is used to having symptoms with the left side of her body such as contracture to her hand, neck pain and spasticity, but she has never had any issues with her right side before.  She denies any weakness to the right upper extremity, but does note numbness to the left lateral aspect of her hand intermittently for about 2 to 3 weeks now.   Past Medical History:  Diagnosis Date  . Pollen allergies     Patient Active Problem List   Diagnosis Date Noted  . HNP (herniated nucleus pulposus) with myelopathy, cervical 02/25/2014    Past Surgical History:  Procedure Laterality Date  . ANTERIOR CERVICAL DECOMP/DISCECTOMY FUSION N/A 02/28/2014   Procedure: ANTERIOR CERVICAL DECOMPRESSION/DISCECTOMY FUSION 1 LEVEL;  Surgeon: Eldred MangesMark C Yates, MD;  Location: [facility] OR;  Service: Orthopedics;  Laterality: N/A;  C5-6 Anterior Cervical Discectomy and Fusion, Allograft, Plate  . OVARIAN CYST REMOVAL     age 43  . TONSILLECTOMY     age of 5     OB History   No obstetric history on file.      Home Medications    Prior to Admission medications   Medication Sig Start Date  End Date Taking? Authorizing Provider  buPROPion (WELLBUTRIN XL) 300 MG 24 hr tablet  04/17/17  Yes [provider]  EPINEPHrine HCl (ASTHMANEFRIN IN) Inhale 1 puff into the lungs daily.    [provider]  fexofenadine-pseudoephedrine (ALLEGRA-D 24) 180-240 MG per 24 hr tablet Take 1 tablet by mouth daily.    [provider]  methocarbamol (ROBAXIN) 500 MG tablet Take 1 tablet (500 mg total) by mouth every 8 (eight) hours as needed for muscle spasms (spasm). 04/22/19   Ashelynn Marks, Chase PicketJaime Pilcher, PA-C  oxyCODONE-acetaminophen (ROXICET) 5-325 MG tablet Take 1 tablet by mouth every 6 (six) hours as needed. 04/22/19   Joshua Zeringue, Chase PicketJaime Pilcher, PA-C    Family History No family history on file.  Social History Social History   Tobacco Use  . Smoking status: Former Smoker    Years: 15.00  . Smokeless tobacco: Current User  Substance Use Topics  . Alcohol use: No    Comment: 3 drinks a month maybe  . Drug use: No     Allergies   Patient has no known allergies.   Review of Systems Review of Systems  Musculoskeletal: Positive for neck pain.  Neurological: Positive for numbness. Negative for weakness.  All other systems reviewed and are negative.    Physical Exam Updated Vital Signs BP 127/86   Pulse 95   Temp 98.3 F (36.8 C) (Oral)  Resp 14   Ht 5\' 6"  (1.676 m)   Wt 81.2 kg   LMP 04/08/2019   SpO2 100%   BMI 28.89 kg/m   Physical Exam Vitals signs and nursing note reviewed.  Constitutional:      General: She is not in acute distress.    Appearance: She is well-developed.  HENT:     Head: Normocephalic and atraumatic.  Neck:     Musculoskeletal: Neck supple.     Comments: No reproducible neck tenderness.  Full range of motion of the neck. Cardiovascular:     Rate and Rhythm: Normal rate and regular rhythm.     Heart sounds: Normal heart sounds. No murmur.  Pulmonary:     Effort: Pulmonary effort is normal. No respiratory distress.     Breath  sounds: Normal breath sounds.  Abdominal:     General: There is no distension.     Palpations: Abdomen is soft.     Tenderness: There is no abdominal tenderness.  Musculoskeletal:     Comments: Pain when lifting her arm above her head.  Full strength to the right upper extremity.  Reported decreased sensation to the ulnar aspect of the hand.  Strong grip strength and pincer grasp.  Able to keep thumb up against resistance.  Skin:    General: Skin is warm and dry.  Neurological:     Mental Status: She is alert and oriented to person, place, and time.      ED Treatments / Results  Labs (all labs ordered are listed, but only abnormal results are displayed) Labs Reviewed - No data to display  EKG None  Radiology No results found.  Procedures Procedures (including critical care time)  Medications Ordered in ED Medications  oxyCODONE-acetaminophen (PERCOCET/ROXICET) 5-325 MG per tablet 1 tablet (1 tablet Oral Given 04/22/19 1707)     Initial Impression / Assessment and Plan / ED Course  I have reviewed the triage vital signs and the nursing notes.  Pertinent labs & imaging results that were available during my care of the patient were reviewed by me and considered in my medical decision making (see chart for details).       Kylie Romero is a 42 y.o. female who presents to ED for numbness to the ulnar side of her right hand for 2 weeks with new right-sided neck pain for the last 3 days.  Pain does radiate down her arm.  Worse with certain movements, but is not reproducible with palpation.  She has complex medical history including Brown-Squard syndrome and cervical fusion.  She has no weakness on exam today, but does have subjective diminished sensation on aspect of her right hand.  Other than sensory deficit, no other exam findings of concern.  Feel that she does need an MRI, but not necessarily emergently. No MRI at facility today here.  Made arrangements for her to get MRI at  at [facility] on Sunday.  Gust symptomatic management and follow-up care including imaging.  Reasons to return to the emergency department were discussed at length.  All questions answered.  Patient seen by and discussed with Dr. Zavitz who agrees with treatment plan.    Final Clinical Impressions(s) / ED Diagnoses   Final diagnoses:  Neck pain  Cervical radiculopathy    ED Discharge Orders         Ordered    MR CERVICAL SPINE WO CONTRAST     06 /11/20 1704    methocarbamol (ROBAXIN) 500 MG tablet  Every  8 hours PRN     04/22/19 1714    oxyCODONE-acetaminophen (ROXICET) 5-325 MG tablet  Every 6 hours PRN     04/22/19 1714           Carvin Almas, Chase PicketJaime Pilcher, PA-C 04/22/19 1720    Blane OharaZavitz, Joshua, MD 04/22/19 2310

## 2019-04-22 NOTE — ED Notes (Signed)
Pt verbalized understanding of dc instructions.

## 2019-04-22 NOTE — ED Triage Notes (Signed)
Pain in the right side of her neck x 3 days. Painful to turn her head or lift her right arm.

## 2019-04-24 ENCOUNTER — Emergency Department (HOSPITAL_COMMUNITY): Payer: BC Managed Care – PPO

## 2019-04-24 ENCOUNTER — Other Ambulatory Visit: Payer: Self-pay

## 2019-04-24 ENCOUNTER — Encounter (HOSPITAL_COMMUNITY): Payer: Self-pay | Admitting: Emergency Medicine

## 2019-04-24 ENCOUNTER — Emergency Department (HOSPITAL_COMMUNITY)
Admission: EM | Admit: 2019-04-24 | Discharge: 2019-04-24 | Disposition: A | Discharge status: Home / Self Care | Payer: BC Managed Care – PPO | Attending: Emergency Medicine | Admitting: Emergency Medicine

## 2019-04-24 DIAGNOSIS — M546 Pain in thoracic spine: Secondary | ICD-10-CM | POA: Insufficient documentation

## 2019-04-24 DIAGNOSIS — M6281 Muscle weakness (generalized): Secondary | ICD-10-CM | POA: Diagnosis not present

## 2019-04-24 DIAGNOSIS — G542 Cervical root disorders, not elsewhere classified: Secondary | ICD-10-CM | POA: Diagnosis not present

## 2019-04-24 DIAGNOSIS — F17290 Nicotine dependence, other tobacco product, uncomplicated: Secondary | ICD-10-CM | POA: Insufficient documentation

## 2019-04-24 DIAGNOSIS — M542 Cervicalgia: Secondary | ICD-10-CM | POA: Diagnosis present

## 2019-04-24 MED ORDER — LIDOCAINE 5 % EX PTCH
1.0000 | MEDICATED_PATCH | CUTANEOUS | Status: DC
  Administered 2019-04-24: 1 via TRANSDERMAL
  Filled 2019-04-24: qty 1

## 2019-04-24 MED ORDER — HYDROMORPHONE HCL 1 MG/ML IJ SOLN
0.5000 mg | Freq: Once | INTRAMUSCULAR | Status: AC
  Administered 2019-04-24: 0.5 mg via INTRAMUSCULAR
  Filled 2019-04-24: qty 1

## 2019-04-24 MED ORDER — PREDNISONE 20 MG PO TABS
60.0000 mg | ORAL_TABLET | Freq: Once | ORAL | Status: AC
  Administered 2019-04-24: 60 mg via ORAL
  Filled 2019-04-24: qty 3

## 2019-04-24 MED ORDER — PREDNISONE 10 MG (21) PO TBPK: ORAL_TABLET | Freq: Every day | ORAL | 0 refills | Status: DC

## 2019-04-24 MED ORDER — LIDOCAINE 5 % EX PTCH: 1.0000 | MEDICATED_PATCH | CUTANEOUS | 0 refills | Status: AC

## 2019-04-24 NOTE — ED Notes (Signed)
Patient transported to MRI 

## 2019-04-24 NOTE — Discharge Instructions (Addendum)
Take prednisone as prescribed, starting tomorrow. Do not take other antiinflammatories (ibuprofen, advil, aleve, naproxen) Continue taking the muscle relaxer and pain medication.  Use tylenol and lidocaine patches as needed for further pain control.  It is important you follow up with Dr. Lorin Mercy for further evaluation and management of your neck.  Return to the ER if you develop fevers, difficulty walking, your arm goes numb and stays numb, or with any new, worsening, or concerning symptoms.
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
## 2019-04-24 NOTE — ED Notes (Signed)
Returned from MRI 

## 2019-04-24 NOTE — ED Triage Notes (Signed)
Pt here on referral form [facility] for MRI of neck. Pt has had trouble lifting her right arm with pain to right shoulder.

## 2019-05-04 ENCOUNTER — Other Ambulatory Visit: Payer: Self-pay

## 2019-05-04 ENCOUNTER — Ambulatory Visit: Payer: Self-pay

## 2019-05-04 ENCOUNTER — Encounter: Payer: Self-pay | Admitting: Orthopaedic Surgery

## 2019-05-04 ENCOUNTER — Ambulatory Visit (INDEPENDENT_AMBULATORY_CARE_PROVIDER_SITE_OTHER): Payer: BC Managed Care – PPO | Admitting: Orthopaedic Surgery

## 2019-05-04 VITALS — Ht 66.0 in | Wt 170.0 lb

## 2019-05-04 DIAGNOSIS — M5020 Other cervical disc displacement, unspecified cervical region: Secondary | ICD-10-CM

## 2019-05-04 DIAGNOSIS — M542 Cervicalgia: Secondary | ICD-10-CM

## 2019-05-04 DIAGNOSIS — M4802 Spinal stenosis, cervical region: Secondary | ICD-10-CM
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
## 2019-05-05 DIAGNOSIS — M4802 Spinal stenosis, cervical region: Secondary | ICD-10-CM | POA: Insufficient documentation

## 2019-05-05 DIAGNOSIS — M5020 Other cervical disc displacement, unspecified cervical region: Secondary | ICD-10-CM | POA: Insufficient documentation

## 2019-05-06 ENCOUNTER — Other Ambulatory Visit (HOSPITAL_COMMUNITY)
Admission: RE | Admit: 2019-05-06 | Discharge: 2019-05-06 | Disposition: A | Discharge status: Home / Self Care | Payer: BC Managed Care – PPO | Source: Ambulatory Visit | Attending: Orthopaedic Surgery | Admitting: Orthopaedic Surgery

## 2019-05-06 DIAGNOSIS — Z1159 Encounter for screening for other viral diseases: Secondary | ICD-10-CM | POA: Diagnosis not present

## 2019-05-06 LAB — SARS CORONAVIRUS 2 (TAT 6-24 HRS): SARS Coronavirus 2: NEGATIVE

## 2019-05-07 ENCOUNTER — Encounter (HOSPITAL_COMMUNITY): Payer: Self-pay | Admitting: *Deleted

## 2019-05-07 ENCOUNTER — Telehealth: Payer: Self-pay | Admitting: Radiology

## 2019-05-07 ENCOUNTER — Other Ambulatory Visit: Payer: Self-pay

## 2019-05-07 DIAGNOSIS — M24542 Contracture, left hand: Secondary | ICD-10-CM

## 2019-05-07 NOTE — Progress Notes (Signed)
Pt denies SOB, chest pain, and being under the care of a cardiologist. Pt denies having a stress test, echo and cardiac cath. Pt denies having an EKG and chest x ray within the last year. Pt denies recent labs. Pt made aware to stop taking Aspirin (unless otherwise advised by surgeon), vitamins, fish oil and herbal medications. Do not take any NSAIDs ie: Ibuprofen, Advil, Naproxen (Aleve), Motrin, BC and Goody Powder. Pt denies that she and family members tested positive for COVID -19 ( pt had a negative result on 05/06/19 and reminded to quarantine ).  Pt denies that she and family experienced the following symptoms:  Cough yes/no: No Fever (>100.51F)  yes/no: No Runny nose yes/no: No Sore throat yes/no: No Difficulty breathing/shortness of breath  yes/no: No  Have you or a family member traveled in the last 14 days and where? yes/no: No  Please remind your patients and families that hospital visitation restrictions are in effect and the importance of the restrictions.  Pt verbalized understanding of all pre-op instructions.

## 2019-05-07 NOTE — Telephone Encounter (Signed)
Benjiman Core PA-C called and states that Dr. Amedeo Plenty has agreed to see patient once she has had her cervical spine surgery for left hand contracture and brown sequard syndrome.    Referral entered.

## 2019-05-10 ENCOUNTER — Other Ambulatory Visit: Payer: Self-pay

## 2019-05-10 ENCOUNTER — Encounter (HOSPITAL_COMMUNITY): Payer: Self-pay

## 2019-05-10 ENCOUNTER — Encounter (HOSPITAL_COMMUNITY)
Admission: RE | Disposition: A | Discharge status: Home / Self Care | Payer: Self-pay | Source: Home / Self Care | Attending: Orthopaedic Surgery

## 2019-05-10 ENCOUNTER — Ambulatory Visit (HOSPITAL_COMMUNITY): Payer: BC Managed Care – PPO | Admitting: Anesthesiology

## 2019-05-10 ENCOUNTER — Ambulatory Visit (HOSPITAL_COMMUNITY): Payer: BC Managed Care – PPO

## 2019-05-10 ENCOUNTER — Observation Stay (HOSPITAL_COMMUNITY)
Admission: RE | Admit: 2019-05-10 | Discharge: 2019-05-11 | Disposition: A | Discharge status: Home / Self Care | Payer: BC Managed Care – PPO | Attending: Orthopaedic Surgery | Admitting: Orthopaedic Surgery

## 2019-05-10 DIAGNOSIS — M5000 Cervical disc disorder with myelopathy, unspecified cervical region: Secondary | ICD-10-CM | POA: Diagnosis not present

## 2019-05-10 DIAGNOSIS — M4712 Other spondylosis with myelopathy, cervical region: Secondary | ICD-10-CM | POA: Diagnosis not present

## 2019-05-10 DIAGNOSIS — Z87891 Personal history of nicotine dependence: Secondary | ICD-10-CM | POA: Insufficient documentation

## 2019-05-10 DIAGNOSIS — M4802 Spinal stenosis, cervical region: Secondary | ICD-10-CM | POA: Insufficient documentation

## 2019-05-10 DIAGNOSIS — Z981 Arthrodesis status: Secondary | ICD-10-CM | POA: Diagnosis not present

## 2019-05-10 DIAGNOSIS — M50121 Cervical disc disorder at C4-C5 level with radiculopathy: Principal | ICD-10-CM | POA: Insufficient documentation

## 2019-05-10 DIAGNOSIS — Z419 Encounter for procedure for purposes other than remedying health state, unspecified: Secondary | ICD-10-CM

## 2019-05-10 DIAGNOSIS — M50021 Cervical disc disorder at C4-C5 level with myelopathy: Secondary | ICD-10-CM | POA: Insufficient documentation

## 2019-05-10 HISTORY — DX: Other cervical disc displacement, unspecified cervical region: M50.20

## 2019-05-10 HISTORY — PX: HARDWARE REMOVAL: SHX979

## 2019-05-10 HISTORY — PX: ANTERIOR CERVICAL DECOMP/DISCECTOMY FUSION: SHX1161

## 2019-05-10 HISTORY — DX: Paralytic syndrome, unspecified: G83.9

## 2019-05-10 HISTORY — DX: Presence of spectacles and contact lenses: Z97.3

## 2019-05-10 LAB — URINALYSIS, ROUTINE W REFLEX MICROSCOPIC
Bilirubin Urine: NEGATIVE
Glucose, UA: NEGATIVE mg/dL
Ketones, ur: NEGATIVE mg/dL
Nitrite: NEGATIVE
Protein, ur: NEGATIVE mg/dL
Specific Gravity, Urine: 1.013 (ref 1.005–1.030)
pH: 6 (ref 5.0–8.0)

## 2019-05-10 LAB — COMPREHENSIVE METABOLIC PANEL
ALT: 73 U/L — ABNORMAL HIGH (ref 0–44)
AST: 37 U/L (ref 15–41)
Albumin: 3.7 g/dL (ref 3.5–5.0)
Alkaline Phosphatase: 43 U/L (ref 38–126)
Anion gap: 9 (ref 5–15)
BUN: 11 mg/dL (ref 6–20)
CO2: 23 mmol/L (ref 22–32)
Calcium: 9 mg/dL (ref 8.9–10.3)
Chloride: 105 mmol/L (ref 98–111)
Creatinine, Ser: 0.77 mg/dL (ref 0.44–1.00)
GFR calc Af Amer: >60 mL/min (ref >60)
GFR calc non Af Amer: >60 mL/min (ref >60)
Glucose, Bld: 79 mg/dL (ref 70–99)
Potassium: 4.3 mmol/L (ref 3.5–5.1)
Sodium: 137 mmol/L (ref 135–145)
Total Bilirubin: 1.1 mg/dL (ref 0.3–1.2)
Total Protein: 6.1 g/dL — ABNORMAL LOW (ref 6.5–8.1)

## 2019-05-10 LAB — CBC
HCT: 41.8 % (ref 36.0–46.0)
Hemoglobin: 13.7 g/dL (ref 12.0–15.0)
MCH: 32.3 pg (ref 26.0–34.0)
MCHC: 32.8 g/dL (ref 30.0–36.0)
MCV: 98.6 fL (ref 80.0–100.0)
Platelets: 265 10*3/uL (ref 150–400)
RBC: 4.24 MIL/uL (ref 3.87–5.11)
RDW: 12.8 % (ref 11.5–15.5)
WBC: 11.9 10*3/uL — ABNORMAL HIGH (ref 4.0–10.5)
nRBC: 0 % (ref 0.0–0.2)

## 2019-05-10 LAB — ABO/RH: ABO/RH(D): A POS

## 2019-05-10 LAB — SURGICAL PCR SCREEN
MRSA, PCR: NEGATIVE
Staphylococcus aureus: NEGATIVE

## 2019-05-10 LAB — TYPE AND SCREEN
ABO/RH(D): A POS
Antibody Screen: NEGATIVE

## 2019-05-10 SURGERY — ANTERIOR CERVICAL DECOMPRESSION/DISCECTOMY FUSION 1 LEVEL
Anesthesia: General | Site: Neck

## 2019-05-10 MED ORDER — BUPROPION HCL ER (XL) 300 MG PO TB24
300.0000 mg | ORAL_TABLET | Freq: Every day | ORAL | Status: DC
  Administered 2019-05-11: 300 mg via ORAL
  Filled 2019-05-10: qty 1

## 2019-05-10 MED ORDER — PHENYLEPHRINE 40 MCG/ML (10ML) SYRINGE FOR IV PUSH (FOR BLOOD PRESSURE SUPPORT)
PREFILLED_SYRINGE | INTRAVENOUS | Status: AC
  Filled 2019-05-10: qty 10

## 2019-05-10 MED ORDER — DEXAMETHASONE SODIUM PHOSPHATE 10 MG/ML IJ SOLN
INTRAMUSCULAR | Status: DC | PRN
  Administered 2019-05-10: 10 mg via INTRAVENOUS

## 2019-05-10 MED ORDER — CEFAZOLIN SODIUM-DEXTROSE 2-4 GM/100ML-% IV SOLN
INTRAVENOUS | Status: AC
  Filled 2019-05-10: qty 100

## 2019-05-10 MED ORDER — DOCUSATE SODIUM 100 MG PO CAPS
100.0000 mg | ORAL_CAPSULE | Freq: Two times a day (BID) | ORAL | Status: DC
  Administered 2019-05-10 – 2019-05-11 (×2): 100 mg via ORAL
  Filled 2019-05-10 (×2): qty 1

## 2019-05-10 MED ORDER — CEFAZOLIN SODIUM-DEXTROSE 2-4 GM/100ML-% IV SOLN
2.0000 g | INTRAVENOUS | Status: AC
  Administered 2019-05-10: 13:00:00 2 g via INTRAVENOUS

## 2019-05-10 MED ORDER — PROMETHAZINE HCL 25 MG/ML IJ SOLN
INTRAMUSCULAR | Status: AC
  Filled 2019-05-10: qty 1

## 2019-05-10 MED ORDER — ONDANSETRON HCL 4 MG/2ML IJ SOLN
INTRAMUSCULAR | Status: AC
  Filled 2019-05-10: qty 2

## 2019-05-10 MED ORDER — MIDAZOLAM HCL 2 MG/2ML IJ SOLN
INTRAMUSCULAR | Status: AC
  Filled 2019-05-10: qty 2

## 2019-05-10 MED ORDER — ROCURONIUM BROMIDE 10 MG/ML (PF) SYRINGE
PREFILLED_SYRINGE | INTRAVENOUS | Status: DC | PRN
  Administered 2019-05-10: 20 mg via INTRAVENOUS
  Administered 2019-05-10: 50 mg via INTRAVENOUS

## 2019-05-10 MED ORDER — BUPIVACAINE-EPINEPHRINE (PF) 0.5% -1:200000 IJ SOLN
INTRAMUSCULAR | Status: AC
  Filled 2019-05-10: qty 30

## 2019-05-10 MED ORDER — MEPERIDINE HCL 25 MG/ML IJ SOLN: 6.2500 mg | INTRAMUSCULAR | Status: DC | PRN

## 2019-05-10 MED ORDER — MIDAZOLAM HCL 2 MG/2ML IJ SOLN: 0.5000 mg | Freq: Once | INTRAMUSCULAR | Status: DC | PRN

## 2019-05-10 MED ORDER — ONDANSETRON HCL 4 MG/2ML IJ SOLN: 4.0000 mg | Freq: Four times a day (QID) | INTRAMUSCULAR | Status: DC | PRN

## 2019-05-10 MED ORDER — LIDOCAINE 2% (20 MG/ML) 5 ML SYRINGE
INTRAMUSCULAR | Status: AC
  Filled 2019-05-10: qty 5

## 2019-05-10 MED ORDER — SUGAMMADEX SODIUM 200 MG/2ML IV SOLN
INTRAVENOUS | Status: DC | PRN
  Administered 2019-05-10: 150 mg via INTRAVENOUS

## 2019-05-10 MED ORDER — SODIUM CHLORIDE 0.9 % IV SOLN
INTRAVENOUS | Status: DC | PRN
  Administered 2019-05-10: 20 ug/min via INTRAVENOUS

## 2019-05-10 MED ORDER — LIDOCAINE 2% (20 MG/ML) 5 ML SYRINGE
INTRAMUSCULAR | Status: DC | PRN
  Administered 2019-05-10: 80 mg via INTRAVENOUS

## 2019-05-10 MED ORDER — HYDROMORPHONE HCL 1 MG/ML IJ SOLN: 0.5000 mg | INTRAMUSCULAR | Status: DC | PRN

## 2019-05-10 MED ORDER — GLYCOPYRROLATE PF 0.2 MG/ML IJ SOSY
PREFILLED_SYRINGE | INTRAMUSCULAR | Status: AC
  Filled 2019-05-10: qty 2

## 2019-05-10 MED ORDER — OXYCODONE HCL 5 MG PO TABS
5.0000 mg | ORAL_TABLET | ORAL | Status: DC | PRN
  Administered 2019-05-10 – 2019-05-11 (×4): 10 mg via ORAL
  Filled 2019-05-10 (×4): qty 2

## 2019-05-10 MED ORDER — METOCLOPRAMIDE HCL 5 MG/ML IJ SOLN: 5.0000 mg | Freq: Three times a day (TID) | INTRAMUSCULAR | Status: DC | PRN

## 2019-05-10 MED ORDER — DEXAMETHASONE SODIUM PHOSPHATE 10 MG/ML IJ SOLN
INTRAMUSCULAR | Status: AC
  Filled 2019-05-10: qty 1

## 2019-05-10 MED ORDER — HEMOSTATIC AGENTS (NO CHARGE) OPTIME
TOPICAL | Status: DC | PRN
  Administered 2019-05-10: 1 via TOPICAL

## 2019-05-10 MED ORDER — ACETAMINOPHEN 325 MG PO TABS: 325.0000 mg | ORAL_TABLET | Freq: Four times a day (QID) | ORAL | Status: DC | PRN

## 2019-05-10 MED ORDER — ONDANSETRON HCL 4 MG/2ML IJ SOLN
INTRAMUSCULAR | Status: DC | PRN
  Administered 2019-05-10: 4 mg via INTRAVENOUS

## 2019-05-10 MED ORDER — PREGABALIN 50 MG PO CAPS: 100.0000 mg | ORAL_CAPSULE | Freq: Every day | ORAL | Status: DC | PRN

## 2019-05-10 MED ORDER — PROPOFOL 10 MG/ML IV BOLUS
INTRAVENOUS | Status: DC | PRN
  Administered 2019-05-10: 120 mg via INTRAVENOUS

## 2019-05-10 MED ORDER — PROPRANOLOL HCL 10 MG PO TABS
10.0000 mg | ORAL_TABLET | Freq: Two times a day (BID) | ORAL | Status: DC | PRN
  Filled 2019-05-10: qty 1

## 2019-05-10 MED ORDER — FENTANYL CITRATE (PF) 250 MCG/5ML IJ SOLN
INTRAMUSCULAR | Status: DC | PRN
  Administered 2019-05-10: 50 ug via INTRAVENOUS
  Administered 2019-05-10: 100 ug via INTRAVENOUS
  Administered 2019-05-10 (×2): 50 ug via INTRAVENOUS

## 2019-05-10 MED ORDER — PROPOFOL 10 MG/ML IV BOLUS
INTRAVENOUS | Status: AC
  Filled 2019-05-10: qty 20

## 2019-05-10 MED ORDER — MIDAZOLAM HCL 5 MG/5ML IJ SOLN
INTRAMUSCULAR | Status: DC | PRN
  Administered 2019-05-10: 2 mg via INTRAVENOUS

## 2019-05-10 MED ORDER — LACTATED RINGERS IV SOLN
INTRAVENOUS | Status: DC
  Administered 2019-05-10: 11:00:00 via INTRAVENOUS

## 2019-05-10 MED ORDER — ROCURONIUM BROMIDE 10 MG/ML (PF) SYRINGE
PREFILLED_SYRINGE | INTRAVENOUS | Status: AC
  Filled 2019-05-10: qty 10

## 2019-05-10 MED ORDER — ACETAMINOPHEN 500 MG PO TABS
1000.0000 mg | ORAL_TABLET | Freq: Four times a day (QID) | ORAL | Status: DC
  Administered 2019-05-10 – 2019-05-11 (×3): 1000 mg via ORAL
  Filled 2019-05-10 (×3): qty 2

## 2019-05-10 MED ORDER — BUPIVACAINE-EPINEPHRINE 0.5% -1:200000 IJ SOLN
INTRAMUSCULAR | Status: DC | PRN
  Administered 2019-05-10: 6 mL

## 2019-05-10 MED ORDER — ONDANSETRON HCL 4 MG PO TABS: 4.0000 mg | ORAL_TABLET | Freq: Four times a day (QID) | ORAL | Status: DC | PRN

## 2019-05-10 MED ORDER — METOCLOPRAMIDE HCL 5 MG PO TABS: 5.0000 mg | ORAL_TABLET | Freq: Three times a day (TID) | ORAL | Status: DC | PRN

## 2019-05-10 MED ORDER — ACETAMINOPHEN 500 MG PO TABS
ORAL_TABLET | ORAL | Status: AC
  Filled 2019-05-10: qty 2

## 2019-05-10 MED ORDER — ACETAMINOPHEN 500 MG PO TABS
1000.0000 mg | ORAL_TABLET | Freq: Once | ORAL | Status: AC
  Administered 2019-05-10: 1000 mg via ORAL

## 2019-05-10 MED ORDER — HYDROMORPHONE HCL 1 MG/ML IJ SOLN
0.2500 mg | INTRAMUSCULAR | Status: DC | PRN
  Administered 2019-05-10: 0.5 mg via INTRAVENOUS

## 2019-05-10 MED ORDER — MENTHOL 3 MG MT LOZG
1.0000 | LOZENGE | OROMUCOSAL | Status: DC | PRN
  Administered 2019-05-10: 3 mg via ORAL
  Filled 2019-05-10: qty 9

## 2019-05-10 MED ORDER — 0.9 % SODIUM CHLORIDE (POUR BTL) OPTIME
TOPICAL | Status: DC | PRN
  Administered 2019-05-10: 1000 mL

## 2019-05-10 MED ORDER — PROMETHAZINE HCL 25 MG/ML IJ SOLN
6.2500 mg | INTRAMUSCULAR | Status: DC | PRN
  Administered 2019-05-10: 6.25 mg via INTRAVENOUS

## 2019-05-10 MED ORDER — SODIUM CHLORIDE 0.45 % IV SOLN: INTRAVENOUS | Status: DC

## 2019-05-10 MED ORDER — NEOSTIGMINE METHYLSULFATE 3 MG/3ML IV SOSY
PREFILLED_SYRINGE | INTRAVENOUS | Status: AC
  Filled 2019-05-10: qty 6

## 2019-05-10 MED ORDER — HYDROMORPHONE HCL 1 MG/ML IJ SOLN
INTRAMUSCULAR | Status: AC
  Filled 2019-05-10: qty 1

## 2019-05-10 MED ORDER — FENTANYL CITRATE (PF) 250 MCG/5ML IJ SOLN
INTRAMUSCULAR | Status: AC
  Filled 2019-05-10: qty 5

## 2019-05-10 MED ORDER — CHLORHEXIDINE GLUCONATE 4 % EX LIQD: 60.0000 mL | Freq: Once | CUTANEOUS | Status: DC

## 2019-05-10 SURGICAL SUPPLY — 75 items
BANDAGE ACE 4X5 VEL STRL LF (GAUZE/BANDAGES/DRESSINGS) IMPLANT
BANDAGE ACE 6X5 VEL STRL LF (GAUZE/BANDAGES/DRESSINGS) IMPLANT
BANDAGE ESMARK 6X9 LF (GAUZE/BANDAGES/DRESSINGS) IMPLANT
BENZOIN TINCTURE PRP APPL 2/3 (GAUZE/BANDAGES/DRESSINGS) ×3 IMPLANT
BIT DRILL SKYLINE 12MM (BIT) ×1 IMPLANT
BLADE CLIPPER SURG (BLADE) IMPLANT
BNDG COHESIVE 4X5 TAN STRL (GAUZE/BANDAGES/DRESSINGS) IMPLANT
BNDG ESMARK 6X9 LF (GAUZE/BANDAGES/DRESSINGS)
BNDG GAUZE ELAST 4 BULKY (GAUZE/BANDAGES/DRESSINGS) ×3 IMPLANT
BONE CERV LORDOTIC 14.5X12X6 (Bone Implant) ×3 IMPLANT
BUR ROUND FLUTED 4 SOFT TCH (BURR) ×2 IMPLANT
BUR ROUND FLUTED 4MM SOFT TCH (BURR) ×1
CLOSURE STERI-STRIP 1/2X4 (GAUZE/BANDAGES/DRESSINGS) ×1
CLOSURE WOUND 1/2 X4 (GAUZE/BANDAGES/DRESSINGS) ×1
CLSR STERI-STRIP ANTIMIC 1/2X4 (GAUZE/BANDAGES/DRESSINGS) ×2 IMPLANT
COLLAR CERV LO CONTOUR FIRM DE (SOFTGOODS) ×3 IMPLANT
COVER MAYO STAND STRL (DRAPES) ×3 IMPLANT
COVER SURGICAL LIGHT HANDLE (MISCELLANEOUS) ×3 IMPLANT
COVER WAND RF STERILE (DRAPES) ×3 IMPLANT
DRAPE C-ARM 42X72 X-RAY (DRAPES) ×3 IMPLANT
DRAPE HALF SHEET 40X57 (DRAPES) ×3 IMPLANT
DRAPE INCISE IOBAN 66X45 STRL (DRAPES) IMPLANT
DRAPE MICROSCOPE LEICA (MISCELLANEOUS) ×3 IMPLANT
DRAPE ORTHO SPLIT 77X108 STRL (DRAPES)
DRAPE SURG ORHT 6 SPLT 77X108 (DRAPES) IMPLANT
DRILL BIT SKYLINE 12MM (BIT) ×2
DRSG EMULSION OIL 3X3 NADH (GAUZE/BANDAGES/DRESSINGS) ×3 IMPLANT
DRSG PAD ABDOMINAL 8X10 ST (GAUZE/BANDAGES/DRESSINGS) ×3 IMPLANT
DURAPREP 6ML APPLICATOR 50/CS (WOUND CARE) ×3 IMPLANT
ELECT COATED BLADE 2.86 ST (ELECTRODE) ×3 IMPLANT
ELECT REM PT RETURN 9FT ADLT (ELECTROSURGICAL) ×3
ELECTRODE REM PT RTRN 9FT ADLT (ELECTROSURGICAL) ×1 IMPLANT
EVACUATOR 1/8 PVC DRAIN (DRAIN) ×3 IMPLANT
GAUZE SPONGE 4X4 12PLY STRL (GAUZE/BANDAGES/DRESSINGS) ×3 IMPLANT
GLOVE BIOGEL PI IND STRL 8 (GLOVE) ×2 IMPLANT
GLOVE BIOGEL PI INDICATOR 8 (GLOVE) ×4
GLOVE ORTHO TXT STRL SZ7.5 (GLOVE) ×6 IMPLANT
GOWN STRL REUS W/ TWL LRG LVL3 (GOWN DISPOSABLE) ×3 IMPLANT
GOWN STRL REUS W/ TWL XL LVL3 (GOWN DISPOSABLE) ×1 IMPLANT
GOWN STRL REUS W/TWL 2XL LVL3 (GOWN DISPOSABLE) IMPLANT
GOWN STRL REUS W/TWL LRG LVL3 (GOWN DISPOSABLE) ×6
GOWN STRL REUS W/TWL XL LVL3 (GOWN DISPOSABLE) ×2
HEAD HALTER (SOFTGOODS) ×3 IMPLANT
KIT BASIN OR (CUSTOM PROCEDURE TRAY) ×3 IMPLANT
KIT TURNOVER KIT B (KITS) ×3 IMPLANT
MANIFOLD NEPTUNE II (INSTRUMENTS) ×3 IMPLANT
NEEDLE 25GX 5/8IN NON SAFETY (NEEDLE) ×3 IMPLANT
NS IRRIG 1000ML POUR BTL (IV SOLUTION) ×3 IMPLANT
PACK GENERAL/GYN (CUSTOM PROCEDURE TRAY) ×3 IMPLANT
PACK ORTHO CERVICAL (CUSTOM PROCEDURE TRAY) ×3 IMPLANT
PAD ARMBOARD 7.5X6 YLW CONV (MISCELLANEOUS) ×6 IMPLANT
PAD CAST 4YDX4 CTTN HI CHSV (CAST SUPPLIES) ×1 IMPLANT
PADDING CAST COTTON 4X4 STRL (CAST SUPPLIES) ×2
PATTIES SURGICAL .5 X.5 (GAUZE/BANDAGES/DRESSINGS) IMPLANT
PLATE ONE LEVEL SKYLINE 16MM (Plate) ×3 IMPLANT
POSITIONER HEAD DONUT 9IN (MISCELLANEOUS) ×3 IMPLANT
SCREW SKYLINE VARIABLE LG (Screw) ×6 IMPLANT
SCREW VARIABLE SELF TAP 12MM (Screw) ×6 IMPLANT
STAPLER VISISTAT 35W (STAPLE) ×3 IMPLANT
STOCKINETTE IMPERVIOUS 9X36 MD (GAUZE/BANDAGES/DRESSINGS) IMPLANT
STRIP CLOSURE SKIN 1/2X4 (GAUZE/BANDAGES/DRESSINGS) ×2 IMPLANT
SURGIFLO W/THROMBIN 8M KIT (HEMOSTASIS) IMPLANT
SUT BONE WAX W31G (SUTURE) ×3 IMPLANT
SUT ETHILON 4 0 FS 1 (SUTURE) IMPLANT
SUT VIC AB 0 CT1 27 (SUTURE)
SUT VIC AB 0 CT1 27XBRD ANBCTR (SUTURE) IMPLANT
SUT VIC AB 2-0 CT1 27 (SUTURE)
SUT VIC AB 2-0 CT1 TAPERPNT 27 (SUTURE) IMPLANT
SUT VIC AB 3-0 PS2 18 (SUTURE) ×2
SUT VIC AB 3-0 PS2 18XBRD (SUTURE) ×1 IMPLANT
SUT VIC AB 4-0 PS2 27 (SUTURE) ×3 IMPLANT
SYR BULB 3OZ (MISCELLANEOUS) ×3 IMPLANT
TOWEL GREEN STERILE FF (TOWEL DISPOSABLE) ×3 IMPLANT
TOWEL OR 17X26 10 PK STRL BLUE (TOWEL DISPOSABLE) ×3 IMPLANT
WATER STERILE IRR 1000ML POUR (IV SOLUTION) ×3 IMPLANT

## 2019-05-10 NOTE — Transfer of Care (Signed)
Immediate Anesthesia Transfer of Care Note  Patient: Kylie Romero  Procedure(s) Performed: removal c5-6 plate, c4-5 anterior cervical decompression/discectomy fusion, allograft (N/A Neck) hardware removal (N/A Neck)  Patient Location: PACU  Anesthesia Type:General  Level of Consciousness: oriented, drowsy and patient cooperative  Airway & Oxygen Therapy: Patient Spontanous Breathing and Patient connected to nasal cannula oxygen  Post-op Assessment: Report given to RN and Post -op Vital signs reviewed and stable  Post vital signs: Reviewed  Last Vitals:  Vitals Value Taken Time  BP 115/72 05/10/19 1503  Temp 36.9 C 05/10/19 1503  Pulse 77 05/10/19 1506  Resp 15 05/10/19 1506  SpO2 99 % 05/10/19 1506  Vitals shown include unvalidated device data.  Last Pain:  Vitals:   05/10/19 1030  TempSrc:   PainSc: 3       Patients Stated Pain Goal: 1 (76/19/50 9326)  Complications: No apparent anesthesia complications

## 2019-05-10 NOTE — Anesthesia Postprocedure Evaluation (Signed)
Anesthesia Post Note  Patient: Kylie Romero  Procedure(s) Performed: removal c5-6 plate, c4-5 anterior cervical decompression/discectomy fusion, allograft (N/A Neck) hardware removal (N/A Neck)     Patient location during evaluation: PACU Anesthesia Type: General Level of consciousness: sedated, patient cooperative and oriented Pain management: pain level controlled Vital Signs Assessment: post-procedure vital signs reviewed and stable Respiratory status: spontaneous breathing, nonlabored ventilation, respiratory function stable and patient connected to nasal cannula oxygen Cardiovascular status: blood pressure returned to baseline and stable Postop Assessment: no apparent nausea or vomiting Anesthetic complications: no    Last Vitals:  Vitals:   05/10/19 1518 05/10/19 1533  BP: 111/72 107/75  Pulse: 72 72  Resp: 17 17  Temp:  (!) 36.3 C  SpO2: 98% 95%    Last Pain:  Vitals:   05/10/19 1533  TempSrc:   PainSc: 4     LLE Motor Response: Purposeful movement;Responds to commands (05/10/19 1533) LLE Sensation: Full sensation (05/10/19 1533) RLE Motor Response: Purposeful movement;Responds to commands (05/10/19 1533) RLE Sensation: Full sensation (05/10/19 1533)      Seleta Rhymes. Meyah Corle
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
## 2019-05-10 NOTE — Op Note (Signed)
Preop diagnosis: C4-5 cervical disc protrusion with right radiculopathy.  Previous C5-6 fusion with retained plate.  Postop diagnosis: Same  Procedure: Removal of C5-6 plate and screws.  C4-5 anterior cervical discectomy and fusion, allograft and plate.    Surgeon: Annell GreeningMark Mccoy Testa, MD  Assistant: RNFA.  Anesthesia: General oral tracheal +6 cc Marcaine skin local  Drains: 1 Hemovac neck  Implants: Synthes skyline plate 16mm 1 level plate.  12 mm screws.  Rescue screws in C5 and standard screws in C4.  6 mm [employer] [place] tissue bank cervical allograft lordotic x1.  Procedure: After standard prepping and draping with arms tucked at the side intubation preoperative Ancef prophylaxis timeout procedure neck was prepped with DuraPrep after had ultra traction of been applied a yellow ring was used for the head and gel bag underneath the scapula.  Old incision was marked area squared with towels Betadine Vi-Drape sterile female standard the head and thyroid sheets and drapes were applied.  Old incision was opened.  Prominent large vein was taken laterally and dissection down to the plate was performed bluntly spreading with the scissors.  Soft tissue was cut off of the plate and plate was removed on doing the tiny locking screws followed by using the standard screwdriver from the skyline set to remove the 4 screws.  Bone wax was not needed and the fusion was solid healed and not moving.  Patient had had spinal stenosis with large disc herniation at C5-6 several years ago with myelopathic changes in the cord which had persisted and then she had a large disc protrusion at C4-5 right side of her cord with radiculopathy and right arm weakness which was her good hand since patient has some foot drop on the left and weakness in her left hand which is persisted with persistent myelopathic myelomalacia cord changes.  This was an urgent surgery since patient was losing function in her right hand which was her normal  hand.  After plate was removed operative microscope was draped brought in and discectomy was performed at C4-5 just above the screw holes.  Operative microscope was used taking down the disc protrusion which was right paracentral uncovertebral joints were stripped with a Cloward curette trial sizers with a 6 mm graft once dura was completely decompressed spurs were removed and the cord was well visualized.  Some Surgi-Flo was used in the space operative field was dry at the time of the graft was placed it was marked at the midline to make sure was not rotated as it was insert inserted.  CRNA pulled traction on the 2 ropes as a graft was countersunk 2 mm easily.  Graft was secure.  A 14 mm plate was a little short 60 mm plate was selected rescue screws were placed in C5 which had good bite.  Regular 12 mm screws were placed in C4 also 12 mm in length securely.  AP lateral C-arm images confirmed good position of graft plate and screws.  All screws were locked in Hemovac was placed with in and out technique using the trocar in line with the skin incision and standard closure 3-0 Vicryl in the platysma 4-0 Vicryl subcuticular skin closure tincture benzoin Steri-Strips Marcaine infiltration in the skin.  Operative field was dry at the time of closure.  Soft collar was applied.  In the recovery room patient had good relief of her right arm weakness and complete relief of her right arm numbness.  No change in the left body weakness from the old  myelopathic injury at the C5-6 level.

## 2019-05-10 NOTE — Interval H&P Note (Signed)
History and Physical Interval Note:  05/10/2019 12:19 PM  Kylie Romero  has presented today for surgery, with the diagnosis of c4-5 herniated nucleus pulposus with radiculopathy , stenosis, myelopathy.  The various methods of treatment have been discussed with the patient and family. After consideration of risks, benefits and other options for treatment, the patient has consented to  Procedure(s): removal c5-6 plate, c4-5 anterior cervical decompression/discectomy fusion, allograft (N/A) hardware removal (N/A) as a surgical intervention.  The patient's history has been reviewed, patient examined, no change in status, stable for surgery.  I have reviewed the patient's chart and labs.  Questions were answered to the patient's satisfaction.     [name]

## 2019-05-10 NOTE — Anesthesia Preprocedure Evaluation (Addendum)
Anesthesia Evaluation  Patient identified by MRN, date of birth, ID band Patient awake    Reviewed: Allergy & Precautions, NPO status , Patient's Chart, lab work & pertinent test results, reviewed documented beta blocker date and time   Airway Mallampati: II  TM Distance: >3 FB Neck ROM: Full    Dental  (+) Teeth Intact, Dental Advisory Given   Pulmonary former smoker,    Pulmonary exam normal breath sounds clear to auscultation       Cardiovascular negative cardio ROS Normal cardiovascular exam Rhythm:Regular Rate:Normal     Neuro/Psych c4-5 herniated nucleus pulposus with radiculopathy, stenosis, myelopathy  Per surgeon note: Patient with profound deltoid triceps weakness on the right arm with previous Brown-Squard syndrome with left side weakness    GI/Hepatic negative GI ROS, Neg liver ROS,   Endo/Other  negative endocrine ROS  Renal/GU negative Renal ROS     Musculoskeletal negative musculoskeletal ROS (+)   Abdominal   Peds  Hematology negative hematology ROS (+)   Anesthesia Other Findings Day of surgery medications reviewed with the patient.  Reproductive/Obstetrics                             Anesthesia Physical Anesthesia Plan  ASA: III  Anesthesia Plan: General   Post-op Pain Management:    Induction: Intravenous  PONV Risk Score and Plan: 3 and Midazolam, Ondansetron and Dexamethasone  Airway Management Planned: Oral ETT and Video Laryngoscope Planned  Additional Equipment:   Intra-op Plan:   Post-operative Plan: Extubation in OR  Informed Consent: I have reviewed the patients History and Physical, chart, labs and discussed the procedure including the risks, benefits and alternatives for the proposed anesthesia with the patient or authorized representative who has indicated his/her understanding and acceptance.     Dental advisory given  Plan Discussed with:  CRNA  Anesthesia Plan Comments:        Anesthesia Quick Evaluation

## 2019-05-10 NOTE — Anesthesia Procedure Notes (Signed)
Procedure Name: Intubation Date/Time: 05/10/2019 1:05 PM Performed by: Renato Shin, CRNA Pre-anesthesia Checklist: Patient identified, Emergency Drugs available, Suction available and Patient being monitored Patient Re-evaluated:Patient Re-evaluated prior to induction Oxygen Delivery Method: Circle system utilized Preoxygenation: Pre-oxygenation with 100% oxygen Induction Type: IV induction Ventilation: Mask ventilation without difficulty Laryngoscope Size: Glidescope and 3 Grade View: Grade I Tube type: Oral Tube size: 7.0 mm Number of attempts: 1 Airway Equipment and Method: Oral airway,  Rigid stylet and Video-laryngoscopy Placement Confirmation: ETT inserted through vocal cords under direct vision,  positive ETCO2 and breath sounds checked- equal and bilateral Secured at: 21 cm Tube secured with: Tape Dental Injury: Teeth and Oropharynx as per pre-operative assessment  Comments: cspine neutral throughout

## 2019-05-11 DIAGNOSIS — M50121 Cervical disc disorder at C4-C5 level with radiculopathy: Secondary | ICD-10-CM | POA: Diagnosis not present

## 2019-05-11 MED ORDER — OXYCODONE-ACETAMINOPHEN 5-325 MG PO TABS: 1.0000 | ORAL_TABLET | ORAL | 0 refills | Status: AC | PRN
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
## 2019-05-11 NOTE — Progress Notes (Signed)
   Subjective: 1 Day Post-Op Procedure(s) (LRB): removal c5-6 plate, c4-5 anterior cervical decompression/discectomy fusion, allograft (N/A) hardware removal (N/A) Patient reports pain as mild.    Objective: Vital signs in last 24 hours: Temp:  [97.3 F (36.3 C)-98.7 F (37.1 C)] 98.2 F (36.8 C) (06/30 0724) Pulse Rate:  [72-88] 86 (06/30 0724) Resp:  [15-18] 18 (06/30 0724) BP: (107-127)/(64-85) 109/77 (06/30 0724) SpO2:  [94 %-98 %] 96 % (06/30 0724) Weight:  [77.1 kg] 77.1 kg (06/29 1625)  Intake/Output from previous day: 06/29 0701 - 06/30 0700 In: 600 [I.V.:600] Out: 75 [Blood:75] Intake/Output this shift: No intake/output data recorded.  Recent Labs    05/10/19 1022  HGB 13.7   Recent Labs    05/10/19 1022  WBC 11.9*  RBC 4.24  HCT 41.8  PLT 265   Recent Labs    05/10/19 1022  NA 137  K 4.3  CL 105  CO2 23  BUN 11  CREATININE 0.77  GLUCOSE 79  CALCIUM 9.0   No results for input(s): LABPT, INR in the last 72 hours.  right arm strength good, sensory return right UE.   Dg Cervical Spine 2-3 Views  Result Date: 05/10/2019 CLINICAL DATA:  Cervical fusion EXAM: CERVICAL SPINE - 2-3 VIEW; DG C-ARM 61-120 MIN COMPARISON:  05/04/2019 FLUOROSCOPY TIME:  Radiation Exposure Index (as provided by the fluoroscopic device): 0.8 mGy If the device does not provide the exposure index: Fluoroscopy Time:  5 seconds Number of Acquired Images:  2 FINDINGS: Previously seen fixation hardware at C5-6 was removed and new interbody fusion at C4-5 with anterior fixation placed. IMPRESSION: C4-5 fusion. Electronically Signed   By: Inez Catalina M.D.   On: 05/10/2019 14:56   Dg C-arm 1-60 Min  Result Date: 05/10/2019 CLINICAL DATA:  Cervical fusion EXAM: CERVICAL SPINE - 2-3 VIEW; DG C-ARM 61-120 MIN COMPARISON:  05/04/2019 FLUOROSCOPY TIME:  Radiation Exposure Index (as provided by the fluoroscopic device): 0.8 mGy If the device does not provide the exposure index: Fluoroscopy  Time:  5 seconds Number of Acquired Images:  2 FINDINGS: Previously seen fixation hardware at C5-6 was removed and new interbody fusion at C4-5 with anterior fixation placed. IMPRESSION: C4-5 fusion. Electronically Signed   By: Inez Catalina M.D.   On: 05/10/2019 14:56    Assessment/Plan: 1 Day Post-Op Procedure(s) (LRB): removal c5-6 plate, c4-5 anterior cervical decompression/discectomy fusion, allograft (N/A) hardware removal (N/A) Up with therapy, discharge home. Office one week.   [name] 05/11/2019, 7:38 AM

## 2019-05-11 NOTE — Plan of Care (Signed)
Patient alert and oriented, mae's well, voiding adequate amount of urine, swallowing without difficulty, no c/o pain at time of discharge. Patient discharged home with family. Script and discharged instructions given to patient. Patient and family stated understanding of instructions given. Patient has an appointment with Dr. Yates  

## 2019-05-11 NOTE — Discharge Instructions (Signed)
OK to shower with extra collar wrapped in saran wrap. Walk daily. Keep your collar on except when you swap for the shower collar. See Dr. Lorin Mercy in one  Week.

## 2019-05-11 NOTE — Evaluation (Signed)
Occupational Therapy Evaluation Patient Details Name: Kylie Romero MRN: [id number] DOB: September 13, 1976 Today's Date: 05/11/2019    History of Present Illness 43 yo female s/p C4-5 ACDF. 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 Currently, pt requires Supervision for ADLs and functional mobility. Provided education and handout on cervical precautions, bed mobility, collar management, UB ADLs, LB ADLs, and functional transfers; pt demonstrated understanding. Answered all pt questions. Recommend dc home once medically stable per physician. All acute OT needs met and will sign off. Thank you.     Follow Up Recommendations  No OT follow up;Supervision/Assistance - 24 hour    Equipment Recommendations  None recommended by OT    Recommendations for Other Services       Precautions / Restrictions Precautions Precautions: Cervical Precaution Booklet Issued: Yes (comment) Precaution Comments: Educating on cervical precautions and compensatory techniques for ADLs Required Braces or Orthoses: Cervical Brace Cervical Brace: Soft collar Restrictions Weight Bearing Restrictions: No      Mobility Bed Mobility Overal bed mobility: Modified Independent             General bed mobility comments: Educating pt on log roll technique. Performed with increased time  Transfers Overall transfer level: Needs assistance Equipment used: None Transfers: Sit to/from Stand Sit to Stand: Supervision         General transfer comment: Supervision for safety    Balance Overall balance assessment: Mild deficits observed, not formally tested                                         ADL either performed or assessed with clinical judgement   ADL Overall ADL's : Needs assistance/impaired                                       General ADL  Comments: Pt will performing ADLs and functional mobility at supervision level. Providing education on cervical precautions, bed mobility, collar management, and compensatory techniques for ADLs.     Vision         Perception     Praxis      Pertinent Vitals/Pain Pain Assessment: Faces Faces Pain Scale: Hurts a little bit Pain Location: Neck Pain Descriptors / Indicators: Constant;Discomfort Pain Intervention(s): Monitored during session     Hand Dominance Right   Extremity/Trunk Assessment Upper Extremity Assessment Upper Extremity Assessment: LUE deficits/detail;RUE deficits/detail RUE Deficits / Details: Reporting numbness and decreased coorindation at right hand prior to sx. Now WFL LUE Deficits / Details: Limited ROM at digits with second through fifth digit in flexion LUE Coordination: decreased fine motor   Lower Extremity Assessment Lower Extremity Assessment: Defer to PT evaluation   Cervical / Trunk Assessment Cervical / Trunk Assessment: Other exceptions Cervical / Trunk Exceptions: s/p cervical sx   Communication Communication Communication: No difficulties   Cognition Arousal/Alertness: Awake/alert Behavior During Therapy: WFL for tasks assessed/performed Overall Cognitive Status: Within Functional Limits for tasks assessed                                     General Comments  Exercises     Shoulder Instructions      Home Living Family/patient expects to be discharged to:: Private residence Living Arrangements: Other relatives(Roommates) Available Help at Discharge: Friend(s);Available PRN/intermittently Type of Home: House Home Access: Level entry     Home Layout: One level(Split level)     Bathroom Shower/Tub: Teacher, early years/pre: Standard     Home Equipment: None          Prior Functioning/Environment Level of Independence: Independent        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         OT Problem List: Decreased strength;Decreased range of motion;Decreased activity tolerance;Impaired balance (sitting and/or standing);Decreased knowledge of use of DME or AE;Decreased knowledge of precautions;Pain;Impaired UE functional use;Decreased coordination      OT Treatment/Interventions:      OT Goals(Current goals can be found in the care plan section) Acute Rehab OT Goals Patient Stated Goal: Go home OT Goal Formulation: All assessment and education complete, DC therapy  OT Frequency:     Barriers to D/C:            Co-evaluation              AM-PAC OT "6 Clicks" Daily Activity     Outcome Measure Help from another person eating meals?: None Help from another person taking care of personal grooming?: A Little Help from another person toileting, which includes using toliet, bedpan, or urinal?: A Little Help from another person bathing (including washing, rinsing, drying)?: A Little Help from another person to put on and taking off regular upper body clothing?: A Little Help from another person to put on and taking off regular lower body clothing?: A Little 6 Click Score: 19   End of Session Equipment Utilized During Treatment: Cervical collar Nurse Communication: Mobility status  Activity Tolerance: Patient tolerated treatment well Patient left: in chair;with call bell/phone within reach  OT Visit Diagnosis: Unsteadiness on feet (R26.81);Muscle weakness (generalized) (M62.81);Pain Pain - part of body: (Neck)                Time: 7366-8159 OT Time Calculation (min): 16 min Charges:  OT General Charges $OT Visit: 1 Visit OT Evaluation $OT Eval Low Complexity: Zap, OTR/L Acute Rehab Pager: [phone] Office: [place] 05/11/2019, 9:09 AM

## 2019-05-12 ENCOUNTER — Telehealth: Payer: Self-pay

## 2019-05-12 ENCOUNTER — Inpatient Hospital Stay: Payer: BC Managed Care – PPO | Admitting: Orthopaedic Surgery

## 2019-05-12 NOTE — Telephone Encounter (Signed)
I called and discussed.  Patient can cut back on her pain medicines since her neck is just sore not painful.  Right arm feels much better.  She can take over-the-counter Benadryl which will help.  She will decrease the pain medication.  FYI

## 2019-05-12 NOTE — Telephone Encounter (Signed)
Patient asking for a different pain medication. She states the oxycodone is making her face itch

## 2019-05-13 ENCOUNTER — Encounter (HOSPITAL_COMMUNITY): Payer: Self-pay | Admitting: Orthopaedic Surgery

## 2019-05-19 ENCOUNTER — Ambulatory Visit (INDEPENDENT_AMBULATORY_CARE_PROVIDER_SITE_OTHER): Payer: BC Managed Care – PPO | Admitting: Orthopaedic Surgery

## 2019-05-19 ENCOUNTER — Ambulatory Visit: Payer: Self-pay

## 2019-05-19 ENCOUNTER — Encounter: Payer: Self-pay | Admitting: Orthopaedic Surgery

## 2019-05-19 ENCOUNTER — Other Ambulatory Visit: Payer: Self-pay

## 2019-05-19 VITALS — Ht 66.0 in | Wt 169.0 lb

## 2019-05-19 DIAGNOSIS — Z981 Arthrodesis status: Secondary | ICD-10-CM

## 2019-05-19 NOTE — Progress Notes (Signed)
   Office Visit Note   Patient: Kylie Romero           Date of Birth: [birth year]           MRN: [id number] Visit Date: 05/19/2019              Requested by: No referring provider defined for this encounter. PCP: System, Pcp Not In   Assessment & Plan: Visit Diagnoses:  1. Status post cervical spinal fusion     Plan: Return 5 weeks lateral C-spine flexion-extension lateral images obtained on return.  Follow-Up Instructions: No follow-ups on file.   Orders:  Orders Placed This Encounter  Procedures  . XR Cervical Spine 2 or 3 views   No orders of the defined types were placed in this encounter.     Procedures: No procedures performed   Clinical Data: No additional findings.   Subjective: Chief Complaint  Patient presents with  . Neck - Routine Post Op    05/10/2019 Removal C5-6 plate, C4-5 ACDF, Allograft Plate    HPI  Review of Systems   Objective: Vital Signs: Ht 5\' 6"  (1.676 m)   Wt 169 lb (76.7 kg)   LMP 05/01/2019 (Approximate)   BMI 27.28 kg/m   Physical Exam  Ortho Exam  Specialty Comments:  No specialty comments available.  Imaging: No results found.   PMFS History: Patient Active Problem List   Diagnosis Date Noted  . Foraminal stenosis of cervical region 05/05/2019  . Protrusion of cervical intervertebral disc 05/05/2019  . HNP (herniated nucleus pulposus) with myelopathy, cervical 02/25/2014   Past Medical History:  Diagnosis Date  . HNP (herniated nucleus pulposus), cervical    and stenosis  . 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 Pollen allergies   . Wears glasses     No family history on file.  Past Surgical History:  Procedure Laterality Date  . ANTERIOR CERVICAL DECOMP/DISCECTOMY FUSION N/A 02/28/2014   Procedure: ANTERIOR CERVICAL DECOMPRESSION/DISCECTOMY FUSION 1 LEVEL;  Surgeon: [name], MD;  Location: [facility];  Service: Orthopedics;  Laterality: N/A;  C5-6 Anterior Cervical Discectomy and Fusion,  Allograft, Plate  . ANTERIOR CERVICAL DECOMP/DISCECTOMY FUSION N/A 05/10/2019   Procedure: removal c5-6 plate, c4-5 anterior cervical decompression/discectomy fusion, allograft;  Surgeon: [name], MD;  Location: [facility];  Service: Orthopedics;  Laterality: N/A;  . HARDWARE REMOVAL N/A 05/10/2019   Procedure: hardware removal;  Surgeon: [name], MD;  Location: [facility];  Service: Orthopedics;  Laterality: N/A;  . OVARIAN CYST REMOVAL     age 38  . TONSILLECTOMY     age of 89   Social History   Occupational History  . Not on file  Tobacco Use  . Smoking status: Former Smoker    Years: 15.00    Types: Cigarettes  . Smokeless tobacco: Never Used  Substance and Sexual Activity  . Alcohol use: Not Currently  . Drug use: No  . Sexual activity: Not on file    Comment: 2015- quit 6 years ago

## 2019-05-21 NOTE — Discharge Summary (Signed)
Patient ID: Kylie Romero MRN: [id number] DOB/AGE: Feb 11, 1976 43 y.o.  Admit date: 05/10/2019 Discharge date: 05/21/2019  Admission Diagnoses:  Active Problems:   HNP (herniated nucleus pulposus) with myelopathy, cervical   Discharge Diagnoses:  Active Problems:   HNP (herniated nucleus pulposus) with myelopathy, cervical  status post Procedure(s): removal c5-6 plate, c4-5 anterior cervical decompression/discectomy fusion, allograft hardware removal  Past Medical History:  Diagnosis Date  . HNP (herniated nucleus pulposus), cervical    and stenosis  . [place] ([place])    " due to spinal cord injury"  . Pollen allergies   . Wears glasses     Surgeries: Procedure(s): removal c5-6 plate, c4-5 anterior cervical decompression/discectomy fusion, allograft hardware removal on 05/10/2019   Consultants:   Discharged Condition: Improved  Hospital Course: Kylie Romero is an 43 y.o. female who was admitted 05/10/2019 for operative treatment of cervical myelopathy. Patient failed conservative treatments (please see the history and physical for the specifics) and had severe unremitting pain that affects sleep, daily activities and work/hobbies. After pre-op clearance, the patient was taken to the operating room on 05/10/2019 and underwent  Procedure(s): removal c5-6 plate, c4-5 anterior cervical decompression/discectomy fusion, allograft hardware removal.    Patient was given perioperative antibiotics:  Anti-infectives (From admission, onward)   Start     Dose/Rate Route Frequency Ordered Stop   05/10/19 1200  ceFAZolin (ANCEF) IVPB 2g/100 mL premix     2 g 200 mL/hr over 30 Minutes Intravenous On call to O.R. 05/10/19 1022 05/10/19 1339   05/10/19 1024  ceFAZolin (ANCEF) 2-4 GM/100ML-% IVPB    Note to Pharmacy: Lorne Skeens   : cabinet override      05/10/19 1024 05/10/19 1309       Patient was given sequential compression devices and early ambulation to prevent DVT.   Patient  benefited maximally from hospital stay and there were no complications. At the time of discharge, the patient was urinating/moving their bowels without difficulty, tolerating a regular diet, pain is controlled with oral pain medications and they have been cleared by PT/OT.   Recent vital signs: No data found.   Recent laboratory studies: No results for input(s): WBC, HGB, HCT, PLT, NA, K, CL, CO2, BUN, CREATININE, GLUCOSE, INR, CALCIUM in the last 72 hours.  Invalid input(s): PT, 2   Discharge Medications:   Allergies as of 05/11/2019   No Known Allergies     Medication List    STOP taking these medications   acetaminophen 500 MG tablet Commonly known as: TYLENOL   predniSONE 10 MG (21) Tbpk tablet Commonly known as: STERAPRED UNI-PAK 21 TAB     TAKE these medications   lidocaine 5 % Commonly known as: Lidoderm Place 1 patch onto the skin daily. Remove & Discard patch within 12 hours or as directed by MD   methocarbamol 500 MG tablet Commonly known as: Robaxin Take 1 tablet (500 mg total) by mouth every 8 (eight) hours as needed for muscle spasms (spasm).   oxyCODONE-acetaminophen 5-325 MG tablet Commonly known as: Percocet Take 1 tablet by mouth every 4 (four) hours as needed for severe pain. What changed:   when to take this  reasons to take this   pregabalin 100 MG capsule Commonly known as: LYRICA Take 100 mg by mouth daily as needed (pain).   propranolol 10 MG tablet Commonly known as: INDERAL Take 10 mg by mouth 2 (two) times daily as needed (anxiety).   Wellbutrin XL 300 MG 24 hr tablet Generic  drug: buPROPion Take 300 mg by mouth daily.       Diagnostic Studies: Dg Cervical Spine 2-3 Views  Result Date: 05/10/2019 CLINICAL DATA:  Cervical fusion EXAM: CERVICAL SPINE - 2-3 VIEW; DG C-ARM 61-120 MIN COMPARISON:  05/04/2019 FLUOROSCOPY TIME:  Radiation Exposure Index (as provided by the fluoroscopic device): 0.8 mGy If the device does not provide the  exposure index: Fluoroscopy Time:  5 seconds Number of Acquired Images:  2 FINDINGS: Previously seen fixation hardware at C5-6 was removed and new interbody fusion at C4-5 with anterior fixation placed. IMPRESSION: C4-5 fusion. Electronically Signed   By: Alcide CleverMark  Lukens M.D.   On: 05/10/2019 14:56   Mr Cervical Spine Wo Contrast  Result Date: 04/24/2019 CLINICAL DATA:  Difficulty lifting the right arm. Pain in the right shoulder. EXAM: MRI CERVICAL SPINE WITHOUT CONTRAST TECHNIQUE: Multiplanar, multisequence MR imaging of the cervical spine was performed. No intravenous contrast was administered. COMPARISON:  Report from cervical spine MRI dated 02/09/2014 FINDINGS: Alignment: Mild straightening of the typical cervical lordosis. No subluxation. Vertebrae: ACDF at C5-6. Type 2 degenerative endplate findings at C6-7. Mild right degenerative facet edema at C4-5. Loss of intervertebral disc height at C6-7. Small hemangioma in the T2 vertebral body. Cord: There is narrowing of the cervical cord with associated abnormal T2 signal especially eccentric to the left centered at the C5-6 level, extending over a 1.1 cm vertical excursion. Accentuated T2 signal was reported in this vicinity on the prior MRI from 02/09/2014, and the reduction in volume of the cord this location suggests myelomalacia/gliosis. No additional lesion is identified. Posterior Fossa, vertebral arteries, paraspinal tissues: The right cerebellar tonsil extends 5 mm beneath the foramen magnum, a borderline appearance for care malformation. Disc levels: C2-3: No impingement.  Bilateral facet spurring. C3-4: Moderate right foraminal stenosis due to uncinate and facet spurring. C4-5: Prominent right foraminal stenosis and moderate central narrowing of the thecal sac due to uncinate and facet spurring along with disc bulge. C5-6: No impingement. This level is fused. Cord abnormality at this level is discussed above. C6-7: Moderate central narrowing of the  thecal sac and moderate bilateral foraminal stenosis due to uncinate spurring and disc bulge. C7-T1: No impingement.  Small central disc protrusion. IMPRESSION: 1. Cervical spondylosis and degenerative disc disease cause prominent impingement at C4-5, and moderate impingement at C3-4 and C6-7, as detailed above. 2. Solid interbody fusion at C5-6 without impingement at this level. However, there is notable left eccentric residual myelomalacia/gliosis in the cord at the C5-6 level. Abnormal cord signal was also reported in this vicinity on the prior MRI cervical spine of 02/09/2014. Electronically Signed   By: Gaylyn RongWalter  Liebkemann M.D.   On: 04/24/2019 14:10   Dg C-arm 1-60 Min  Result Date: 05/10/2019 CLINICAL DATA:  Cervical fusion EXAM: CERVICAL SPINE - 2-3 VIEW; DG C-ARM 61-120 MIN COMPARISON:  05/04/2019 FLUOROSCOPY TIME:  Radiation Exposure Index (as provided by the fluoroscopic device): 0.8 mGy If the device does not provide the exposure index: Fluoroscopy Time:  5 seconds Number of Acquired Images:  2 FINDINGS: Previously seen fixation hardware at C5-6 was removed and new interbody fusion at C4-5 with anterior fixation placed. IMPRESSION: C4-5 fusion. Electronically Signed   By: Alcide CleverMark  Lukens M.D.   On: 05/10/2019 14:56   Xr Cervical Spine 2 Or 3 Views  Result Date: 05/19/2019 AP lateral C-spine x-rays are obtained and reviewed this shows cervical plate screws and graft at C 4-5.  Previous plate from C 5-6 has been  removed.  Good position alignment.  This space narrowing and some spurring is again noted at C6-7. Impression: Interval removal plate C 5-6 with solid fusion.  Postop C4-5 ACDF allograft and plate in satisfactory position.  Xr Cervical Spine 2 Or 3 Views  Result Date: 05/05/2019 AP lateral cervical spine x-rays are obtained and reviewed.  This shows previous C5-6 fusion with good incorporation allograft and plate and some Z6-[id number]-C7 disc space narrowing.  Remaining levels disc heights are  maintained. Impression: Previous C5-6 fusion with some adjacent C6-7 disc space narrowing.     Follow-up Information    Eldred MangesYates, Mark C, MD Follow up in 1 week(s).   Specialty: Orthopedic Surgery Contact information: [address] [place] [address] [phone]           Discharge Plan:  discharge to home  Disposition:     Signed: Zonia KiefJames Mitali Shenefield for Annell GreeningMark Yates MD   05/21/2019, 10:58 AM

## 2019-06-23 ENCOUNTER — Other Ambulatory Visit: Payer: Self-pay

## 2019-06-23 ENCOUNTER — Ambulatory Visit (INDEPENDENT_AMBULATORY_CARE_PROVIDER_SITE_OTHER): Payer: BC Managed Care – PPO

## 2019-06-23 ENCOUNTER — Encounter: Payer: Self-pay | Admitting: Orthopaedic Surgery

## 2019-06-23 ENCOUNTER — Ambulatory Visit (INDEPENDENT_AMBULATORY_CARE_PROVIDER_SITE_OTHER): Payer: BC Managed Care – PPO | Admitting: Orthopaedic Surgery

## 2019-06-23 VITALS — BP 125/83 | HR 93 | Ht 66.0 in | Wt 169.0 lb

## 2019-06-23 DIAGNOSIS — Z981 Arthrodesis status: Secondary | ICD-10-CM

## 2019-06-23 NOTE — Progress Notes (Signed)
   Post-Op Visit Note   Patient: Kylie Romero           Date of Birth: [birth year]           MRN: [id number] Visit Date: 06/23/2019 PCP: System, Pcp Not In   Assessment & Plan: Follow-up removal C5-6 plate and R6-7 ACDF on 05/10/2019.  Patient states since the surgery she is gotten complete relief of her pain.  She stopped wearing the collar on Monday at 6 weeks.  X-rays show no motion.  She is released back to normal activities.  Chief Complaint:  Chief Complaint  Patient presents with  . Neck - Follow-up    05/10/2019 Removal C5-6 plate, C4-5 ACDF, Allograft   Visit Diagnoses:  1. Status post cervical spinal fusion     Plan: Patient is happy with the surgical result good relief of her pain and weakness in her arm.  She still has an AFO which she has been using chronically.  I will check her back again on a as needed basis.  Follow-Up Instructions: Return if symptoms worsen or fail to improve.   Orders:  Orders Placed This Encounter  Procedures  . XR Cervical Spine 2 or 3 views   No orders of the defined types were placed in this encounter.   Imaging: Xr Cervical Spine 2 Or 3 Views  Result Date: 06/23/2019 AP and lateral flexion-extension sleep spine x-rays demonstrate partial incorporation of the C4-5 graft without motion on flexion-extension. Impression: Post C4-5 fusion without evidence of motion and partial incorporation of the autograft.   PMFS History: Patient Active Problem List   Diagnosis Date Noted  . Foraminal stenosis of cervical region 05/05/2019  . Protrusion of cervical intervertebral disc 05/05/2019  . HNP (herniated nucleus pulposus) with myelopathy, cervical 02/25/2014   Past Medical History:  Diagnosis Date  . HNP (herniated nucleus pulposus), cervical    and stenosis  . Paralysis ([place])    " due to spinal cord injury"  . Pollen allergies   . Wears glasses     No family history on file.  Past Surgical History:  Procedure Laterality Date  .  ANTERIOR CERVICAL DECOMP/DISCECTOMY FUSION N/A 02/28/2014   Procedure: ANTERIOR CERVICAL DECOMPRESSION/DISCECTOMY FUSION 1 LEVEL;  Surgeon: [name], MD;  Location: [facility];  Service: Orthopedics;  Laterality: N/A;  C5-6 Anterior Cervical Discectomy and Fusion, Allograft, Plate  . ANTERIOR CERVICAL DECOMP/DISCECTOMY FUSION N/A 05/10/2019   Procedure: removal c5-6 plate, c4-5 anterior cervical decompression/discectomy fusion, allograft;  Surgeon: [name], MD;  Location: [facility];  Service: Orthopedics;  Laterality: N/A;  . HARDWARE REMOVAL N/A 05/10/2019   Procedure: hardware removal;  Surgeon: [name], MD;  Location: [facility];  Service: Orthopedics;  Laterality: N/A;  . OVARIAN CYST REMOVAL     age 73  . TONSILLECTOMY     age of 79   Social History   Occupational History  . Not on file  Tobacco Use  . Smoking status: Former Smoker    Years: 15.00    Types: Cigarettes  . Smokeless tobacco: Never Used  Substance and Sexual Activity  . Alcohol use: Not Currently  . Drug use: No  . Sexual activity: Not on file    Comment: 2015- quit 6 years ago

## 2021-02-28 IMAGING — RF DG C-ARM 61-120 MIN
1 series · 2 of 2 positions shown · non-contrast
Comparison: 05/04/2019

CLINICAL DATA: Cervical fusion

EXAM:
CERVICAL SPINE - 2-3 VIEW; DG C-ARM 61-120 MIN

[Series 1: run · 2 of 2 slices shown]
[im 1/2]
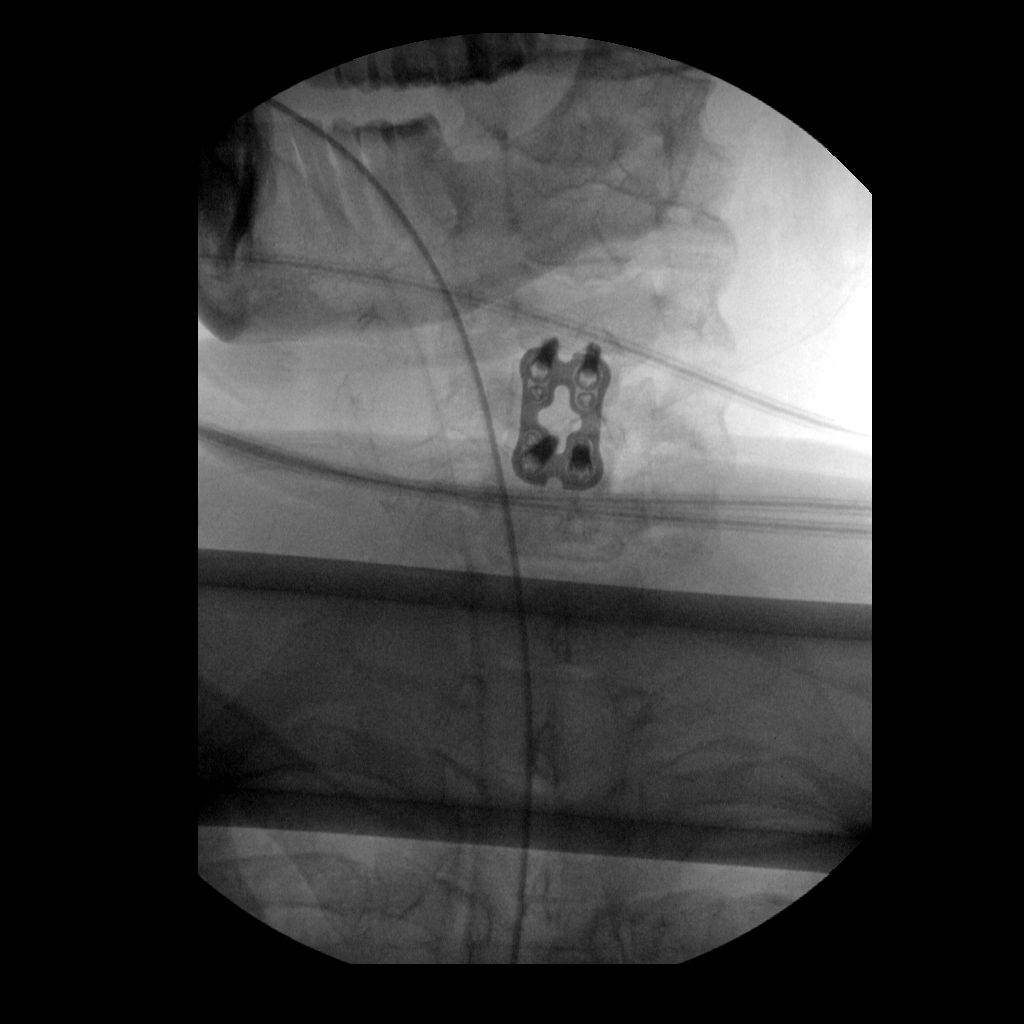
[im 2/2]
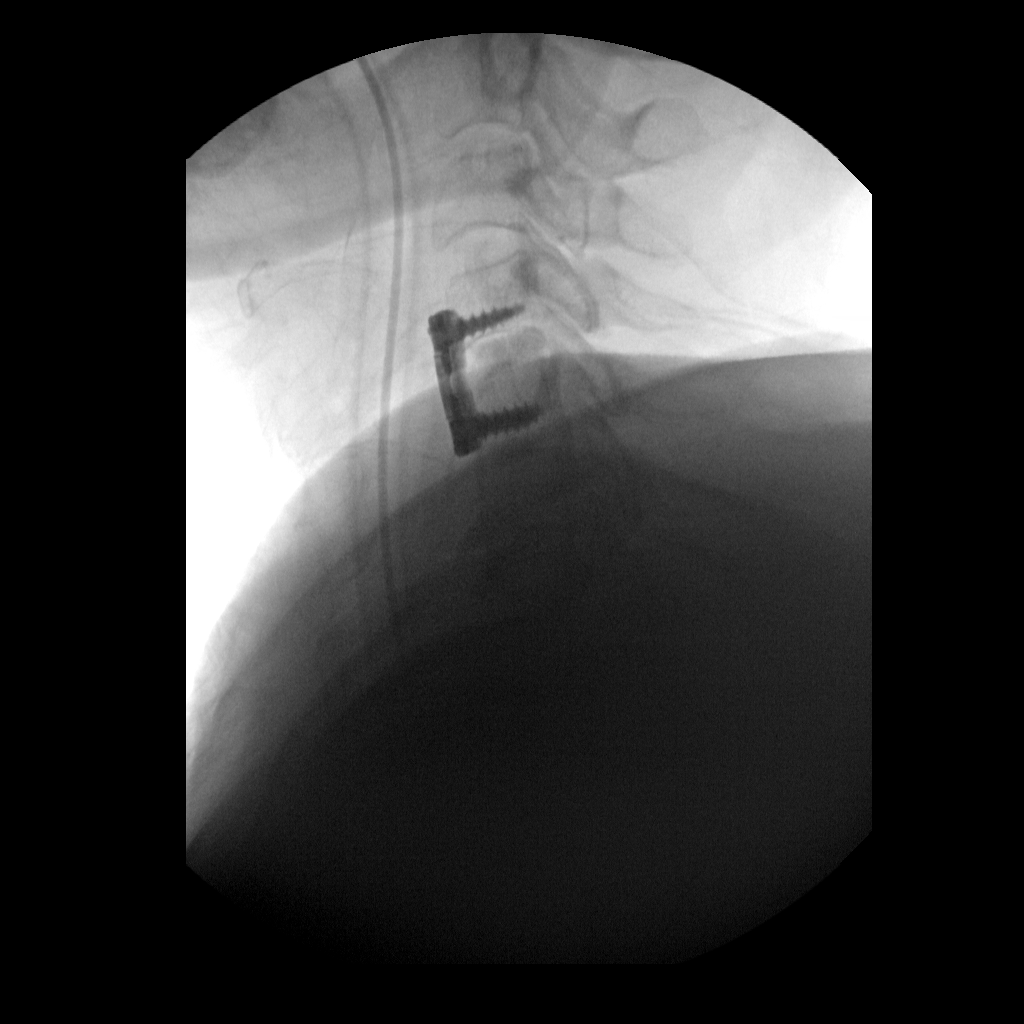

[2 of 2 positions shown; findings below may reference images not displayed]

FLUOROSCOPY TIME:  Radiation Exposure Index (as provided by the
fluoroscopic device): 0.8 mGy

If the device does not provide the exposure index:

Fluoroscopy Time:  5 seconds

Number of Acquired Images:  2
FINDINGS: Previously seen fixation hardware at C5-6 was removed and new
interbody fusion at C4-5 with anterior fixation placed.
IMPRESSION: C4-5 fusion.
